# Patient Record
Sex: Male | Born: 1995 | Race: White | Hispanic: No | Marital: Single | Smoking: Never smoker
Health system: Southern US, Community
[De-identification: ages and names within clinical notes are randomized; demographics above are authoritative.]

## PROBLEM LIST (undated history)

## (undated) DIAGNOSIS — M419 Scoliosis, unspecified: Secondary | ICD-10-CM

## (undated) DIAGNOSIS — F32A Depression, unspecified: Secondary | ICD-10-CM

## (undated) DIAGNOSIS — E039 Hypothyroidism, unspecified: Secondary | ICD-10-CM

## (undated) DIAGNOSIS — L709 Acne, unspecified: Secondary | ICD-10-CM

## (undated) DIAGNOSIS — G4730 Sleep apnea, unspecified: Secondary | ICD-10-CM

## (undated) HISTORY — DX: Depression, unspecified: F32.A

## (undated) HISTORY — PX: TYMPANOSTOMY TUBE PLACEMENT: SHX32

---
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
## 2017-08-18 DIAGNOSIS — R0683 Snoring: Secondary | ICD-10-CM | POA: Insufficient documentation

## 2017-08-18 DIAGNOSIS — H6983 Other specified disorders of Eustachian tube, bilateral: Secondary | ICD-10-CM | POA: Insufficient documentation

## 2017-08-18 DIAGNOSIS — H6993 Unspecified Eustachian tube disorder, bilateral: Secondary | ICD-10-CM | POA: Insufficient documentation

## 2017-10-09 ENCOUNTER — Telehealth (HOSPITAL_COMMUNITY): Payer: Self-pay | Admitting: Professional

## 2017-10-13 ENCOUNTER — Other Ambulatory Visit (HOSPITAL_COMMUNITY): Payer: BLUE CROSS/BLUE SHIELD | Attending: Psychiatry | Admitting: Licensed Clinical Social Worker

## 2017-10-13 DIAGNOSIS — F332 Major depressive disorder, recurrent severe without psychotic features: Secondary | ICD-10-CM | POA: Diagnosis not present

## 2017-10-15 ENCOUNTER — Telehealth (HOSPITAL_COMMUNITY): Payer: Self-pay | Admitting: Professional

## 2017-10-16 ENCOUNTER — Telehealth (HOSPITAL_COMMUNITY): Payer: Self-pay | Admitting: Professional

## 2017-10-16 NOTE — Psych (Signed)
Comprehensive Clinical Assessment (CCA) Note  10/16/2017 Elijah [name] [id number]  Visit Diagnosis:      ICD-10-CM   1. MDD (major depressive disorder), recurrent severe, without psychosis (HCC) F33.2       CCA Part One  Part One has been completed on paper by the patient.  (See scanned document in Chart Review)  CCA Part Two A  Intake/Chief Complaint:  CCA Intake With Chief Complaint CCA Part Two Date: 10/13/17 CCA Part Two Time: 1430 Chief Complaint/Presenting Problem: Pt reports to PHP for CCA per Dr. Elisabeth Most. Pt shares he has struggled with depression for years but it has gotten "much worse" over the last 3 months or so. Pt shares he quit his job due to his symptoms and feeling unable to complete tasks. Pt reports a decrease in appetite and 50lbs in the last year. Pt reports he stays at home and plays video games instead of socializing like he used to. Pt states he was in school [employer]) summer '18 and dropped out after 12 days. Pt tried school again at [employer] for spring semester '19 and had to withdrawal from classes halfway through the semester due to symptoms.  Pt reports passive SI with thoughts of "I don't want to feel anymore" or "It would be easier not to be here anymore" but denies any plans or attempts. Pt denies any acts of self-harm. Pt reports he has seen [facility] for therapy for 2 months but does not see any improvement. He was started on Wellbutrin 1 month ago and does not see any improvement. Pt denies HI/AVH Patients Currently Reported Symptoms/Problems: depression, anxiety, passive SI, anhedonia, hopelessness, worthlessness, decrease in appetite, loss of weight, isolation, irritability, mood swings, racing thoughts, trouble with sleep (too much and lack of) Collateral Involvement: Pt's mother brought him to appointment but stayed in waiting room Individual's Strengths: Supportive family Initial Clinical Notes/Concerns: Pt is hesitant about group therapy. Pt wants to  think about joining group and agrees to contact cln tomorrow.  Mental Health Symptoms Depression:  Depression: Change in energy/activity, Difficulty Concentrating, Fatigue, Hopelessness, Increase/decrease in appetite, Irritability, Sleep (too much or little), Weight gain/loss, Worthlessness  Mania:     Anxiety:      Psychosis:     Trauma:     Obsessions:     Compulsions:     Inattention:     Hyperactivity/Impulsivity:     Oppositional/Defiant Behaviors:     Borderline Personality:     Other Mood/Personality Symptoms:      Mental Status Exam Appearance and self-care  Stature:  Stature: Average  Weight:  Weight: Average weight  Clothing:  Clothing: Casual  Grooming:  Grooming: Normal  Cosmetic use:  Cosmetic Use: None  Posture/gait:  Posture/Gait: Normal  Motor activity:  Motor Activity: Not Remarkable  Sensorium  Attention:  Attention: Normal  Concentration:  Concentration: Normal  Orientation:  Orientation: X5  Recall/memory:  Recall/Memory: Normal  Affect and Mood  Affect:  Affect: Flat  Mood:  Mood: Depressed  Relating  Eye contact:  Eye Contact: Fleeting  Facial expression:  Facial Expression: Depressed  Attitude toward examiner:  Attitude Toward Examiner: Cooperative  Thought and Language  Speech flow: Speech Flow: Soft, Normal  Thought content:  Thought Content: Appropriate to mood and circumstances  Preoccupation:     Hallucinations:     Organization:     Company secretary of Knowledge:     Intelligence:  Intelligence: Average  Abstraction:  Abstraction: Normal  Judgement:  Judgement:  Poor  Reality Testing:  Reality Testing: Adequate  Insight:  Insight: Poor  Decision Making:  Decision Making: Confused  Social Functioning  Social Maturity:  Social Maturity: Isolates  Social Judgement:  Social Judgement: Normal  Stress  Stressors:  Stressors: Arts administrator, Work, Illness  Coping Ability:  Coping Ability: Horticulturist, commercial Deficits:     Supports:       Family and Psychosocial History: Family history Marital status: Single Does patient have children?: No  Childhood History:  Childhood History By whom was/is the patient raised?: Both parents Additional childhood history information: Pt reports parents divorced when he was 7. Father moved away when he was 10 and saw him 4x a year. Father moved back to [place] when pt was 18. Mother remarried when pt was teen Description of patient's relationship with caregiver when they were a child: Pt reports a "good relationship though we are very different" with mother. Pt shares he was not very close with Dad because pt feels Dad put more effort and energy into older brother because they both played hockey and pt does not. Patient's description of current relationship with people who raised him/her: Pt reports mother is still supportive. Pt lives with mother and step-father now. Pt reports he is not talking to his biological father at this time. Does patient have siblings?: Yes Number of Siblings: 2 Description of patient's current relationship with siblings: 1 older brother: pt reports they are close and see each other about 1x a month. 1 older step-sister: pt reports they do not talk much Did patient suffer any verbal/emotional/physical/sexual abuse as a child?: No(Pt reports a ex-gf cheated on him while in high school and believes that is emotional abuse.) Did patient suffer from severe childhood neglect?: No Has patient ever been sexually abused/assaulted/raped as an adolescent or adult?: No Was the patient ever a victim of a crime or a disaster?: No Witnessed domestic violence?: No Has patient been effected by domestic violence as an adult?: No  CCA Part Two B  Employment/Work Situation: Employment / Work Psychologist, occupational Employment situation: Unemployed Patient's job has been impacted by current illness: Yes Describe how patient's job has been impacted: Pt reports he quit his job due to being unable to  get to work and concentrate on tasks due to depression. 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?: No Are There Guns or Other Weapons in Your Home?: No  Education: Education Did Garment/textile technologist From [employer]?: Yes Did Theme park manager?: Yes(Pt attended [employer] and [employer]. Pt dropped out of both first semester) Did You Have An Individualized Education Program (IIEP): No Did You Have Any Difficulty At School?: Yes(Pt dropped out of [employer] and [employer] due to depression symptoms) Were Any Medications Ever Prescribed For These Difficulties?: No  Religion: Religion/Spirituality Are You A Religious Person?: No  Leisure/Recreation: Leisure / Recreation Leisure and Hobbies: video games; watch You Tube videos  Exercise/Diet: Exercise/Diet Do You Exercise?: No Have You Gained or Lost A Significant Amount of Weight in the Past Six Months?: Yes-Lost Number of Pounds Lost?: 50(Pt reports loss of 50lbs in a year without trying) Do You Follow a Special Diet?: No Do You Have Any Trouble Sleeping?: Yes Explanation of Sleeping Difficulties: Pt reports he feels he sleeps too much at times and then cannot sleep at others. Pt shares  he is up until 2-5am and wakes up 11a-12:30p most days. Pt shares he struggles to get to sleep most evenings.  CCA Part Two C  Alcohol/Drug Use: Alcohol / Drug Use Prescriptions: Wellburtin 300mg  History of alcohol / drug use?: Yes Substance #1 Name of Substance 1: Marijuana 1 - Age of First Use: 19 1 - Amount (size/oz): 3.5 grams to 14 grams  1 - Frequency: daily 1 - Duration: 2 years 1 - Last Use / Amount: 2 days ago - 3.5 grams (pt reports he is not using every day right now due to lack of money to buy pot)    CCA Part Three  ASAM's:  Six Dimensions of Multidimensional  Assessment  Dimension 1:  Acute Intoxication and/or Withdrawal Potential:     Dimension 2:  Biomedical Conditions and Complications:     Dimension 3:  Emotional, Behavioral, or Cognitive Conditions and Complications:     Dimension 4:  Readiness to Change:     Dimension 5:  Relapse, Continued use, or Continued Problem Potential:     Dimension 6:  Recovery/Living Environment:      Substance use Disorder (SUD)    Social Function:  Social Functioning Social Maturity: Isolates Social Judgement: Normal  Stress:  Stress Stressors: Arts administratorMoney, Work, Illness Coping Ability: Exhausted Patient Takes Medications The Way The Doctor Instructed?: Yes Priority Risk: Moderate Risk  Risk Assessment- Self-Harm Potential: Risk Assessment For Self-Harm Potential Thoughts of Self-Harm: Vague current thoughts Method: No plan Availability of Means: No access/NA Additional Comments for Self-Harm Potential: Pt reports current passive SI but denies a plan or previous attempts  Risk Assessment -Dangerous to Others Potential: Risk Assessment For Dangerous to Others Potential Method: No Plan  DSM5 Diagnoses: There are no active problems to display for this patient.   Patient Centered Plan: Patient is on the following Treatment Plan(s):  Depression  Recommendations for Services/Supports/Treatments: Recommendations for Services/Supports/Treatments Recommendations For Services/Supports/Treatments: Partial Hospitalization(Pt could benefit from medication management and learning coping skills to manage skills. Pt has passive SI. Pt reports he is unsure about participating in group and would like to think about it and call cln tomorrow.)  Treatment Plan Summary:  Pt reports "I don't want to feel like this."  Referrals to Alternative Service(s): Referred to Alternative Service(s):   Place:   Date:   Time:    Referred to Alternative Service(s):   Place:   Date:   Time:    Referred to Alternative Service(s):    Place:   Date:   Time:    Referred to Alternative Service(s):   Place:   Date:   Time:     Elijah [name] J Ryan Ogborn, LPCA. LCASA

## 2019-02-23 DIAGNOSIS — F900 Attention-deficit hyperactivity disorder, predominantly inattentive type: Secondary | ICD-10-CM | POA: Diagnosis not present

## 2019-02-23 DIAGNOSIS — F4322 Adjustment disorder with anxiety: Secondary | ICD-10-CM | POA: Diagnosis not present

## 2019-04-19 DIAGNOSIS — Z03818 Encounter for observation for suspected exposure to other biological agents ruled out: Secondary | ICD-10-CM | POA: Diagnosis not present

## 2019-04-19 DIAGNOSIS — R05 Cough: Secondary | ICD-10-CM | POA: Diagnosis not present

## 2019-06-25 DIAGNOSIS — U071 COVID-19: Secondary | ICD-10-CM | POA: Diagnosis not present

## 2019-07-21 DIAGNOSIS — F419 Anxiety disorder, unspecified: Secondary | ICD-10-CM | POA: Diagnosis not present

## 2019-07-21 DIAGNOSIS — F338 Other recurrent depressive disorders: Secondary | ICD-10-CM | POA: Diagnosis not present

## 2019-07-21 DIAGNOSIS — F902 Attention-deficit hyperactivity disorder, combined type: Secondary | ICD-10-CM | POA: Diagnosis not present

## 2019-07-21 DIAGNOSIS — Z79899 Other long term (current) drug therapy: Secondary | ICD-10-CM | POA: Diagnosis not present

## 2019-08-18 DIAGNOSIS — F902 Attention-deficit hyperactivity disorder, combined type: Secondary | ICD-10-CM | POA: Diagnosis not present

## 2019-08-18 DIAGNOSIS — F419 Anxiety disorder, unspecified: Secondary | ICD-10-CM | POA: Diagnosis not present

## 2019-08-18 DIAGNOSIS — F338 Other recurrent depressive disorders: Secondary | ICD-10-CM | POA: Diagnosis not present

## 2019-08-18 DIAGNOSIS — Z79899 Other long term (current) drug therapy: Secondary | ICD-10-CM | POA: Diagnosis not present

## 2019-11-17 DIAGNOSIS — F419 Anxiety disorder, unspecified: Secondary | ICD-10-CM | POA: Diagnosis not present

## 2019-11-17 DIAGNOSIS — F902 Attention-deficit hyperactivity disorder, combined type: Secondary | ICD-10-CM | POA: Diagnosis not present

## 2019-11-17 DIAGNOSIS — F338 Other recurrent depressive disorders: Secondary | ICD-10-CM | POA: Diagnosis not present

## 2019-11-17 DIAGNOSIS — Z79899 Other long term (current) drug therapy: Secondary | ICD-10-CM | POA: Diagnosis not present

## 2019-12-14 DIAGNOSIS — F332 Major depressive disorder, recurrent severe without psychotic features: Secondary | ICD-10-CM | POA: Diagnosis not present

## 2019-12-20 DIAGNOSIS — F332 Major depressive disorder, recurrent severe without psychotic features: Secondary | ICD-10-CM | POA: Diagnosis not present

## 2019-12-29 DIAGNOSIS — F332 Major depressive disorder, recurrent severe without psychotic features: Secondary | ICD-10-CM | POA: Diagnosis not present

## 2020-01-09 DIAGNOSIS — F332 Major depressive disorder, recurrent severe without psychotic features: Secondary | ICD-10-CM | POA: Diagnosis not present

## 2020-01-10 DIAGNOSIS — F332 Major depressive disorder, recurrent severe without psychotic features: Secondary | ICD-10-CM | POA: Diagnosis not present

## 2020-01-13 DIAGNOSIS — F332 Major depressive disorder, recurrent severe without psychotic features: Secondary | ICD-10-CM | POA: Diagnosis not present

## 2020-01-16 DIAGNOSIS — F332 Major depressive disorder, recurrent severe without psychotic features: Secondary | ICD-10-CM | POA: Diagnosis not present

## 2020-01-19 DIAGNOSIS — F332 Major depressive disorder, recurrent severe without psychotic features: Secondary | ICD-10-CM | POA: Diagnosis not present

## 2020-01-23 DIAGNOSIS — F332 Major depressive disorder, recurrent severe without psychotic features: Secondary | ICD-10-CM | POA: Diagnosis not present

## 2020-02-13 DIAGNOSIS — F332 Major depressive disorder, recurrent severe without psychotic features: Secondary | ICD-10-CM | POA: Diagnosis not present

## 2020-02-14 DIAGNOSIS — F332 Major depressive disorder, recurrent severe without psychotic features: Secondary | ICD-10-CM | POA: Diagnosis not present

## 2020-02-15 DIAGNOSIS — F332 Major depressive disorder, recurrent severe without psychotic features: Secondary | ICD-10-CM | POA: Diagnosis not present

## 2020-02-17 DIAGNOSIS — F332 Major depressive disorder, recurrent severe without psychotic features: Secondary | ICD-10-CM | POA: Diagnosis not present

## 2020-02-20 DIAGNOSIS — F332 Major depressive disorder, recurrent severe without psychotic features: Secondary | ICD-10-CM | POA: Diagnosis not present

## 2020-02-21 DIAGNOSIS — F332 Major depressive disorder, recurrent severe without psychotic features: Secondary | ICD-10-CM | POA: Diagnosis not present

## 2020-02-22 DIAGNOSIS — F332 Major depressive disorder, recurrent severe without psychotic features: Secondary | ICD-10-CM | POA: Diagnosis not present

## 2020-02-24 DIAGNOSIS — F332 Major depressive disorder, recurrent severe without psychotic features: Secondary | ICD-10-CM | POA: Diagnosis not present

## 2020-02-27 DIAGNOSIS — F332 Major depressive disorder, recurrent severe without psychotic features: Secondary | ICD-10-CM | POA: Diagnosis not present

## 2020-02-28 DIAGNOSIS — F332 Major depressive disorder, recurrent severe without psychotic features: Secondary | ICD-10-CM | POA: Diagnosis not present

## 2020-03-01 DIAGNOSIS — F332 Major depressive disorder, recurrent severe without psychotic features: Secondary | ICD-10-CM | POA: Diagnosis not present

## 2020-03-02 DIAGNOSIS — F332 Major depressive disorder, recurrent severe without psychotic features: Secondary | ICD-10-CM | POA: Diagnosis not present

## 2020-03-05 DIAGNOSIS — F332 Major depressive disorder, recurrent severe without psychotic features: Secondary | ICD-10-CM | POA: Diagnosis not present

## 2020-03-06 DIAGNOSIS — F332 Major depressive disorder, recurrent severe without psychotic features: Secondary | ICD-10-CM | POA: Diagnosis not present

## 2020-03-08 DIAGNOSIS — F332 Major depressive disorder, recurrent severe without psychotic features: Secondary | ICD-10-CM | POA: Diagnosis not present

## 2020-03-13 DIAGNOSIS — F332 Major depressive disorder, recurrent severe without psychotic features: Secondary | ICD-10-CM | POA: Diagnosis not present

## 2020-03-26 DIAGNOSIS — F332 Major depressive disorder, recurrent severe without psychotic features: Secondary | ICD-10-CM | POA: Diagnosis not present

## 2020-03-27 DIAGNOSIS — F332 Major depressive disorder, recurrent severe without psychotic features: Secondary | ICD-10-CM | POA: Diagnosis not present

## 2020-03-28 DIAGNOSIS — F332 Major depressive disorder, recurrent severe without psychotic features: Secondary | ICD-10-CM | POA: Diagnosis not present

## 2020-04-03 DIAGNOSIS — F332 Major depressive disorder, recurrent severe without psychotic features: Secondary | ICD-10-CM | POA: Diagnosis not present

## 2020-04-04 DIAGNOSIS — F332 Major depressive disorder, recurrent severe without psychotic features: Secondary | ICD-10-CM | POA: Diagnosis not present

## 2020-04-09 DIAGNOSIS — F332 Major depressive disorder, recurrent severe without psychotic features: Secondary | ICD-10-CM | POA: Diagnosis not present

## 2020-04-10 DIAGNOSIS — F332 Major depressive disorder, recurrent severe without psychotic features: Secondary | ICD-10-CM | POA: Diagnosis not present

## 2020-04-11 ENCOUNTER — Ambulatory Visit: Payer: BC Managed Care – PPO | Admitting: Family Medicine

## 2020-04-11 ENCOUNTER — Encounter: Payer: Self-pay | Admitting: Family Medicine

## 2020-04-11 ENCOUNTER — Other Ambulatory Visit: Payer: Self-pay

## 2020-04-11 VITALS — BP 120/64 | HR 55 | Temp 97.7°F | Wt 211.4 lb

## 2020-04-11 DIAGNOSIS — K645 Perianal venous thrombosis: Secondary | ICD-10-CM | POA: Diagnosis not present

## 2020-04-11 DIAGNOSIS — K625 Hemorrhage of anus and rectum: Secondary | ICD-10-CM

## 2020-04-11 DIAGNOSIS — K6289 Other specified diseases of anus and rectum: Secondary | ICD-10-CM | POA: Diagnosis not present

## 2020-04-11 MED ORDER — HYDROCODONE-ACETAMINOPHEN 5-325 MG PO TABS: 1.0000 | ORAL_TABLET | Freq: Four times a day (QID) | ORAL | 0 refills | Status: AC | PRN

## 2020-04-11 NOTE — Progress Notes (Signed)
Subjective:    Patient ID: Elijah [name], male    DOB: November 23, 1995, 24 y.o.   MRN: [id number]  HPI Chief Complaint  Patient presents with  . Rectal Bleeding    Rectal bleeding since Monday. None stop, pain when sitting down since saturday   He is new to the practice and reports no regular medical care recently.   Here with complaints of rectal pain for the past 5 days and bright red blood from his rectum for the past 3 days. States he is seeing a significant amount of blood. He has noticed constant bleeding. Blood on his sheets.   States he is having 2 bowel movements per day which is his usual.   Denies fever, chills, dizziness, fatigue, unexplained weight loss,  chest pain, palpitations, shortness of breath, abdominal pain, back pain,  N/V/D, urinary symptoms, testicle pain.  Denies history of hemorrhoids or rectal surgery.   Denies personal or family history of IBD or colon cancer.   Taking 2 Aleve twice daily for rectal pain.   States he is seeing a therapist for depression.   Single, no kids. In between jobs.    Review of Systems Pertinent positives and negatives in the history of present illness.     Objective:   Physical Exam Exam conducted with a chaperone present.  Constitutional:      Appearance: Normal appearance.  Eyes:     Conjunctiva/sclera: Conjunctivae normal.     Pupils: Pupils are equal, round, and reactive to light.  Cardiovascular:     Rate and Rhythm: Normal rate and regular rhythm.     Pulses: Normal pulses.     Heart sounds: Normal heart sounds.  Pulmonary:     Effort: Pulmonary effort is normal.     Breath sounds: Normal breath sounds.  Abdominal:     General: Abdomen is flat. Bowel sounds are normal. There is no distension.     Palpations: Abdomen is soft.     Tenderness: There is no abdominal tenderness.  Genitourinary:    Rectum: Internal hemorrhoid present.     Comments: Homero Fellers blood present on rectal examination.  Thrombosed  hemorrhoid present with TTP.  Musculoskeletal:        General: Normal range of motion.     Cervical back: Normal range of motion and neck supple.  Skin:    General: Skin is warm and dry.     Coloration: Skin is not pale.  Neurological:     General: No focal deficit present.     Mental Status: He is alert and oriented to person, place, and time.  Psychiatric:        Mood and Affect: Mood normal.        Thought Content: Thought content normal.    BP 120/64   Pulse (!) 55   Temp 97.7 F (36.5 C)   Wt 211 lb 6.4 oz (95.9 kg)       Assessment & Plan:   Thrombosed hemorrhoids - Plan: HYDROcodone-acetaminophen (NORCO) 5-325 MG tablet  Rectal pain - Plan: CBC with Differential/Platelet, Comprehensive metabolic panel, HYDROcodone-acetaminophen (NORCO) 5-325 MG tablet  BRBPR (bright red blood per rectum) - Plan: CBC with Differential/Platelet, Comprehensive metabolic panel   Kristian Covey, PA-C assisted in procedure: Discussed examination findings, diagnosis, usual course of illness, and options for therapy discussed. After discussing recommendations, patient agrees to procedure for thrombosed hemorrhoid  Procedure Informed consent obtained.  The area was prepped in the usual manner and the skin overlying the  abscess was anesthetized with 1.5cc of 1% lidocaine with epinephrine.   Hemostats were used to isolate and clamp the thrombosed hoemorrhoid.  Scapel used to remove the thrombosed tissue.  Area was irrigated with high pressure saline.  Gauze was placed loosely to absorb seepage.   Discussed after care, hot soapy bath soaks, use of stool softener and baby wipes the next few days, Aleve OTC the next 3-5 days.  Norco prescribed for severe pain and caution advised. PDMP reviewed.   Procedure note by Kristian Covey, PA-C   Follow up: prn.  However, if worse or not improving, recheck promptly.

## 2020-04-11 NOTE — Patient Instructions (Signed)
You may want to take a stool softener for the next week.  It is ok to take Aleve.  Use the hydrocodone as needed for severe pain. Use caution and avoid driving or alcohol with this medication.   Stay well hydrated and have a liquid or soft diet over the next 1-2 days.   If you have any fever, chills, nausea, vomiting or significant bleeding, let me know.     Surgical Procedures for Hemorrhoids, Care After This sheet gives you information about how to care for yourself after your procedure. Your health care provider may also give you more specific instructions. If you have problems or questions, contact your health care provider. What can I expect after the procedure? After the procedure, it is common to have:  Rectal pain.  Pain when you are having a bowel movement.  Slight rectal bleeding. This is more likely to happen with the first bowel movement after surgery. Follow these instructions at home: Medicines  Take over-the-counter and prescription medicines only as told by your health care provider.  If you were prescribed an antibiotic medicine, use it as told by your health care provider. Do not stop using the antibiotic even if your condition improves.  Ask your health care provider if the medicine prescribed to you requires you to avoid driving or using heavy machinery.  Use a stool softener or a bulk laxative as told by your health care provider. Eating and drinking  Follow instructions from your health care provider about what to eat or drink after your procedure.  You may need to take actions to prevent or treat constipation, such as: ? Drink enough fluid to keep your urine pale yellow. ? Take over-the-counter or prescription medicines. ? Eat foods that are high in fiber, such as beans, whole grains, and fresh fruits and vegetables. ? Limit foods that are high in fat and processed sugars, such as fried or sweet foods. Activity   Rest as told by your health care  provider.  Avoid sitting for a long time without moving. Get up to take short walks every 1-2 hours. This is important to improve blood flow and breathing. Ask for help if you feel weak or unsteady.  Return to your normal activities as told by your health care provider. Ask your health care provider what activities are safe for you.  Do not lift anything that is heavier than 10 lb (4.5 kg), or the limit that you are told, until your health care provider says that it is safe.  Do not strain to have a bowel movement.  Do not spend a long time sitting on the toilet. General instructions   Take warm sitz baths for 15-20 minutes, 2-3 times a day to relieve soreness or itching and to keep the rectal area clean.  Apply ice packs to the area to reduce swelling and pain.  Do not drive for 24 hours if you were given a sedative during your procedure.  Keep all follow-up visits as told by your health care provider. This is important. Contact a health care provider if:  Your pain medicine is not helping.  You have a fever or chills.  You have bad smelling drainage.  You have a lot of swelling.  You become constipated.  You have trouble passing urine. Get help right away if:  You have very bad rectal pain.  You have heavy bleeding from your rectum. Summary  After the procedure, it is common to have pain and slight rectal  bleeding.  Take warm sitz baths for 15-20 minutes, 2-3 times a day to relieve soreness or itching and to keep the rectal area clean.  Avoid straining when having a bowel movement.  Eat foods that are high in fiber, such as beans, whole grains, and fresh fruits and vegetables.  Take over-the-counter and prescription medicines only as told by your health care provider. This information is not intended to replace advice given to you by your health care provider. Make sure you discuss any questions you have with your health care provider. Document Revised: 09/29/2018  Document Reviewed: 03/02/2018 Elsevier Patient Education  2020 [employer].

## 2020-04-12 LAB — CBC WITH DIFFERENTIAL/PLATELET
Basophils Absolute: 0 10*3/uL (ref 0.0–0.2)
Basos: 0 %
EOS (ABSOLUTE): 0.2 10*3/uL (ref 0.0–0.4)
Eos: 4 %
Hematocrit: 45.7 % (ref 37.5–51.0)
Hemoglobin: 15.4 g/dL (ref 13.0–17.7)
Immature Grans (Abs): 0 10*3/uL (ref 0.0–0.1)
Immature Granulocytes: 1 %
Lymphocytes Absolute: 2 10*3/uL (ref 0.7–3.1)
Lymphs: 33 %
MCH: 30.3 pg (ref 26.6–33.0)
MCHC: 33.7 g/dL (ref 31.5–35.7)
MCV: 90 fL (ref 79–97)
Monocytes Absolute: 0.5 10*3/uL (ref 0.1–0.9)
Monocytes: 9 %
Neutrophils Absolute: 3.2 10*3/uL (ref 1.4–7.0)
Neutrophils: 53 %
Platelets: 234 10*3/uL (ref 150–450)
RBC: 5.09 x10E6/uL (ref 4.14–5.80)
RDW: 12.6 % (ref 11.6–15.4)
WBC: 6 10*3/uL (ref 3.4–10.8)

## 2020-04-12 LAB — COMPREHENSIVE METABOLIC PANEL
ALT: 26 IU/L (ref 0–44)
AST: 16 IU/L (ref 0–40)
Albumin/Globulin Ratio: 2.2 (ref 1.2–2.2)
Albumin: 4.7 g/dL (ref 4.1–5.2)
Alkaline Phosphatase: 65 IU/L (ref 44–121)
BUN/Creatinine Ratio: 14 (ref 9–20)
BUN: 13 mg/dL (ref 6–20)
Bilirubin Total: 0.4 mg/dL (ref 0.0–1.2)
CO2: 21 mmol/L (ref 20–29)
Calcium: 9.5 mg/dL (ref 8.7–10.2)
Chloride: 104 mmol/L (ref 96–106)
Creatinine, Ser: 0.96 mg/dL (ref 0.76–1.27)
GFR calc Af Amer: 129 mL/min/{1.73_m2} (ref >59)
GFR calc non Af Amer: 112 mL/min/{1.73_m2} (ref >59)
Globulin, Total: 2.1 g/dL (ref 1.5–4.5)
Glucose: 100 mg/dL — ABNORMAL HIGH (ref 65–99)
Potassium: 4.3 mmol/L (ref 3.5–5.2)
Sodium: 142 mmol/L (ref 134–144)
Total Protein: 6.8 g/dL (ref 6.0–8.5)

## 2020-04-12 NOTE — Progress Notes (Signed)
His labs are fine. How is he doing today?

## 2020-04-17 ENCOUNTER — Encounter: Payer: Self-pay | Admitting: Internal Medicine

## 2020-05-02 DIAGNOSIS — F4322 Adjustment disorder with anxiety: Secondary | ICD-10-CM | POA: Diagnosis not present

## 2020-05-02 DIAGNOSIS — F900 Attention-deficit hyperactivity disorder, predominantly inattentive type: Secondary | ICD-10-CM | POA: Diagnosis not present

## 2020-05-10 DIAGNOSIS — F900 Attention-deficit hyperactivity disorder, predominantly inattentive type: Secondary | ICD-10-CM | POA: Diagnosis not present

## 2020-05-10 DIAGNOSIS — F4322 Adjustment disorder with anxiety: Secondary | ICD-10-CM | POA: Diagnosis not present

## 2020-05-23 DIAGNOSIS — F900 Attention-deficit hyperactivity disorder, predominantly inattentive type: Secondary | ICD-10-CM | POA: Diagnosis not present

## 2020-05-23 DIAGNOSIS — F4322 Adjustment disorder with anxiety: Secondary | ICD-10-CM | POA: Diagnosis not present

## 2020-05-28 DIAGNOSIS — F4322 Adjustment disorder with anxiety: Secondary | ICD-10-CM | POA: Diagnosis not present

## 2020-05-28 DIAGNOSIS — F900 Attention-deficit hyperactivity disorder, predominantly inattentive type: Secondary | ICD-10-CM | POA: Diagnosis not present

## 2020-05-31 DIAGNOSIS — F4322 Adjustment disorder with anxiety: Secondary | ICD-10-CM | POA: Diagnosis not present

## 2020-05-31 DIAGNOSIS — F900 Attention-deficit hyperactivity disorder, predominantly inattentive type: Secondary | ICD-10-CM | POA: Diagnosis not present

## 2020-06-06 DIAGNOSIS — F4322 Adjustment disorder with anxiety: Secondary | ICD-10-CM | POA: Diagnosis not present

## 2020-06-06 DIAGNOSIS — F900 Attention-deficit hyperactivity disorder, predominantly inattentive type: Secondary | ICD-10-CM | POA: Diagnosis not present

## 2020-06-07 DIAGNOSIS — F900 Attention-deficit hyperactivity disorder, predominantly inattentive type: Secondary | ICD-10-CM | POA: Diagnosis not present

## 2020-06-07 DIAGNOSIS — F4322 Adjustment disorder with anxiety: Secondary | ICD-10-CM | POA: Diagnosis not present

## 2020-06-15 DIAGNOSIS — F4322 Adjustment disorder with anxiety: Secondary | ICD-10-CM | POA: Diagnosis not present

## 2020-06-15 DIAGNOSIS — F900 Attention-deficit hyperactivity disorder, predominantly inattentive type: Secondary | ICD-10-CM | POA: Diagnosis not present

## 2020-06-21 DIAGNOSIS — F900 Attention-deficit hyperactivity disorder, predominantly inattentive type: Secondary | ICD-10-CM | POA: Diagnosis not present

## 2020-06-21 DIAGNOSIS — F4322 Adjustment disorder with anxiety: Secondary | ICD-10-CM | POA: Diagnosis not present

## 2020-06-22 DIAGNOSIS — F39 Unspecified mood [affective] disorder: Secondary | ICD-10-CM | POA: Diagnosis not present

## 2020-06-28 DIAGNOSIS — F4322 Adjustment disorder with anxiety: Secondary | ICD-10-CM | POA: Diagnosis not present

## 2020-06-28 DIAGNOSIS — F900 Attention-deficit hyperactivity disorder, predominantly inattentive type: Secondary | ICD-10-CM | POA: Diagnosis not present

## 2020-07-05 DIAGNOSIS — F39 Unspecified mood [affective] disorder: Secondary | ICD-10-CM | POA: Diagnosis not present

## 2020-07-20 DIAGNOSIS — F4322 Adjustment disorder with anxiety: Secondary | ICD-10-CM | POA: Diagnosis not present

## 2020-07-20 DIAGNOSIS — F900 Attention-deficit hyperactivity disorder, predominantly inattentive type: Secondary | ICD-10-CM | POA: Diagnosis not present

## 2020-07-24 DIAGNOSIS — F39 Unspecified mood [affective] disorder: Secondary | ICD-10-CM | POA: Diagnosis not present

## 2020-08-29 DIAGNOSIS — F4322 Adjustment disorder with anxiety: Secondary | ICD-10-CM | POA: Diagnosis not present

## 2020-09-06 DIAGNOSIS — F4322 Adjustment disorder with anxiety: Secondary | ICD-10-CM | POA: Diagnosis not present

## 2020-10-05 DIAGNOSIS — F39 Unspecified mood [affective] disorder: Secondary | ICD-10-CM | POA: Diagnosis not present

## 2020-10-11 DIAGNOSIS — F4322 Adjustment disorder with anxiety: Secondary | ICD-10-CM | POA: Diagnosis not present

## 2020-10-17 DIAGNOSIS — F900 Attention-deficit hyperactivity disorder, predominantly inattentive type: Secondary | ICD-10-CM | POA: Diagnosis not present

## 2020-10-17 DIAGNOSIS — F4322 Adjustment disorder with anxiety: Secondary | ICD-10-CM | POA: Diagnosis not present

## 2020-10-26 DIAGNOSIS — F39 Unspecified mood [affective] disorder: Secondary | ICD-10-CM | POA: Diagnosis not present

## 2020-11-01 DIAGNOSIS — F4322 Adjustment disorder with anxiety: Secondary | ICD-10-CM | POA: Diagnosis not present

## 2020-11-07 DIAGNOSIS — F4322 Adjustment disorder with anxiety: Secondary | ICD-10-CM | POA: Diagnosis not present

## 2020-11-07 DIAGNOSIS — F39 Unspecified mood [affective] disorder: Secondary | ICD-10-CM | POA: Diagnosis not present

## 2020-11-16 DIAGNOSIS — F39 Unspecified mood [affective] disorder: Secondary | ICD-10-CM | POA: Diagnosis not present

## 2020-11-20 DIAGNOSIS — F4322 Adjustment disorder with anxiety: Secondary | ICD-10-CM | POA: Diagnosis not present

## 2020-11-28 DIAGNOSIS — F39 Unspecified mood [affective] disorder: Secondary | ICD-10-CM | POA: Diagnosis not present

## 2020-11-30 DIAGNOSIS — F900 Attention-deficit hyperactivity disorder, predominantly inattentive type: Secondary | ICD-10-CM | POA: Diagnosis not present

## 2020-11-30 DIAGNOSIS — F4322 Adjustment disorder with anxiety: Secondary | ICD-10-CM | POA: Diagnosis not present

## 2020-12-05 DIAGNOSIS — F900 Attention-deficit hyperactivity disorder, predominantly inattentive type: Secondary | ICD-10-CM | POA: Diagnosis not present

## 2020-12-05 DIAGNOSIS — F4322 Adjustment disorder with anxiety: Secondary | ICD-10-CM | POA: Diagnosis not present

## 2020-12-06 DIAGNOSIS — F4322 Adjustment disorder with anxiety: Secondary | ICD-10-CM | POA: Diagnosis not present

## 2020-12-06 DIAGNOSIS — F900 Attention-deficit hyperactivity disorder, predominantly inattentive type: Secondary | ICD-10-CM | POA: Diagnosis not present

## 2020-12-06 DIAGNOSIS — F39 Unspecified mood [affective] disorder: Secondary | ICD-10-CM | POA: Diagnosis not present

## 2020-12-13 DIAGNOSIS — F4322 Adjustment disorder with anxiety: Secondary | ICD-10-CM | POA: Diagnosis not present

## 2020-12-13 DIAGNOSIS — F900 Attention-deficit hyperactivity disorder, predominantly inattentive type: Secondary | ICD-10-CM | POA: Diagnosis not present

## 2020-12-13 DIAGNOSIS — F39 Unspecified mood [affective] disorder: Secondary | ICD-10-CM | POA: Diagnosis not present

## 2020-12-19 DIAGNOSIS — F900 Attention-deficit hyperactivity disorder, predominantly inattentive type: Secondary | ICD-10-CM | POA: Diagnosis not present

## 2020-12-19 DIAGNOSIS — F4322 Adjustment disorder with anxiety: Secondary | ICD-10-CM | POA: Diagnosis not present

## 2020-12-26 DIAGNOSIS — F4322 Adjustment disorder with anxiety: Secondary | ICD-10-CM | POA: Diagnosis not present

## 2020-12-26 NOTE — Progress Notes (Deleted)
   Subjective:    Patient ID: Elijah [name], male    DOB: 06-02-1995, 25 y.o.   MRN: [id number]  HPI No chief complaint on file.  He is new to the practice and here for a complete physical exam. Previous medical care: Last CPE:  Other providers:  Past medical history: Surgeries:  Family history: Mental health history:  Social history: Lives with ***, works as ***,  *** Smoking, drinking alcohol, drug use Diet: *** Exercise: ***  Immunizations:  Health maintenance:   Colonoscopy: Last PSA: Last Dental Exam: Last Eye Exam:  Wears seatbelt always, uses sunscreen, smoke detectors in home and functioning, does not text while driving, feels safe in home environment.  Reviewed allergies, medications, past medical, surgical, family, and social history.    Review of Systems Review of Systems Constitutional: -fever, -chills, -sweats, -unexpected weight change,-fatigue ENT: -runny nose, -ear pain, -sore throat Cardiology:  -chest pain, -palpitations, -edema Respiratory: -cough, -shortness of breath, -wheezing Gastroenterology: -abdominal pain, -nausea, -vomiting, -diarrhea, -constipation  Hematology: -bleeding or bruising problems Musculoskeletal: -arthralgias, -myalgias, -joint swelling, -back pain Ophthalmology: -vision changes Urology: -dysuria, -difficulty urinating, -hematuria, -urinary frequency, -urgency Neurology: -headache, -weakness, -tingling, -numbness       Objective:   Physical Exam There were no vitals taken for this visit.  General Appearance:    Alert, cooperative, no distress, appears stated age  Head:    Normocephalic, without obvious abnormality, atraumatic  Eyes:    PERRL, conjunctiva/corneas clear, EOM's intact, fundi    benign  Ears:    Normal TM's and external ear canals  Nose:   Nares normal, mucosa normal, no drainage or sinus   tenderness  Throat:   Lips, mucosa, and tongue normal; teeth and gums normal  Neck:   Supple, no  lymphadenopathy;  thyroid:  no   enlargement/tenderness/nodules; no carotid   bruit or JVD  Back:    Spine nontender, no curvature, ROM normal, no CVA     tenderness  Lungs:     Clear to auscultation bilaterally without wheezes, rales or     ronchi; respirations unlabored  Chest Wall:    No tenderness or deformity   Heart:    Regular rate and rhythm, S1 and S2 normal, no murmur, rub   or gallop  Breast Exam:    No chest wall tenderness, masses or gynecomastia  Abdomen:     Soft, non-tender, nondistended, normoactive bowel sounds,    no masses, no hepatosplenomegaly  Genitalia:    Normal male external genitalia without lesions.  Testicles without masses.  No inguinal hernias.  Rectal:   Deferred due to age <40 and lack of symptoms  Extremities:   No clubbing, cyanosis or edema  Pulses:   2+ and symmetric all extremities  Skin:   Skin color, texture, turgor normal, no rashes or lesions  Lymph nodes:   Cervical, supraclavicular, and axillary nodes normal  Neurologic:   CNII-XII intact, normal strength, sensation and gait; reflexes 2+ and symmetric throughout          Psych:   Normal mood, affect, hygiene and grooming.          Assessment & Plan:  Routine general medical examination at a health care facility

## 2020-12-27 ENCOUNTER — Encounter: Payer: BC Managed Care – PPO | Admitting: Family Medicine

## 2020-12-27 DIAGNOSIS — Z0000 Encounter for general adult medical examination without abnormal findings: Secondary | ICD-10-CM

## 2021-01-01 ENCOUNTER — Encounter: Payer: Self-pay | Admitting: Family Medicine

## 2021-01-02 DIAGNOSIS — F4322 Adjustment disorder with anxiety: Secondary | ICD-10-CM | POA: Diagnosis not present

## 2021-01-02 DIAGNOSIS — F900 Attention-deficit hyperactivity disorder, predominantly inattentive type: Secondary | ICD-10-CM | POA: Diagnosis not present

## 2021-01-04 DIAGNOSIS — F900 Attention-deficit hyperactivity disorder, predominantly inattentive type: Secondary | ICD-10-CM | POA: Diagnosis not present

## 2021-01-04 DIAGNOSIS — F4322 Adjustment disorder with anxiety: Secondary | ICD-10-CM | POA: Diagnosis not present

## 2021-01-09 DIAGNOSIS — F39 Unspecified mood [affective] disorder: Secondary | ICD-10-CM | POA: Diagnosis not present

## 2021-01-11 DIAGNOSIS — F4322 Adjustment disorder with anxiety: Secondary | ICD-10-CM | POA: Diagnosis not present

## 2021-01-16 DIAGNOSIS — F4322 Adjustment disorder with anxiety: Secondary | ICD-10-CM | POA: Diagnosis not present

## 2021-01-23 DIAGNOSIS — F4322 Adjustment disorder with anxiety: Secondary | ICD-10-CM | POA: Diagnosis not present

## 2021-01-23 DIAGNOSIS — F39 Unspecified mood [affective] disorder: Secondary | ICD-10-CM | POA: Diagnosis not present

## 2021-01-27 NOTE — Patient Instructions (Signed)
Preventive Care 21-23 Years Old, Male Preventive care refers to lifestyle choices and visits with your health care provider that can promote health and wellness. This includes: A yearly physical exam. This is also called an annual wellness visit. Regular dental and eye exams. Immunizations. Screening for certain conditions. Healthy lifestyle choices, such as: Eating a healthy diet. Getting regular exercise. Not using drugs or products that contain nicotine and tobacco. Limiting alcohol use. What can I expect for my preventive care visit? Physical exam Your health care provider may check your: Height and weight. These may be used to calculate your BMI (body mass index). BMI is a measurement that tells if you are at a healthy weight. Heart rate and blood pressure. Body temperature. Skin for abnormal spots. Counseling Your health care provider may ask you questions about your: Past medical problems. Family's medical history. Alcohol, tobacco, and drug use. Emotional well-being. Home life and relationship well-being. Sexual activity. Diet, exercise, and sleep habits. Work and work environment. Access to firearms. What immunizations do I need? Vaccines are usually given at various ages, according to a schedule. Your health care provider will recommend vaccines for you based on your age, medical history, and lifestyle or other factors, such as travel or where you work. What tests do I need? Blood tests Lipid and cholesterol levels. These may be checked every 5 years starting at age 20. Hepatitis C test. Hepatitis B test. Screening  Diabetes screening. This is done by checking your blood sugar (glucose) after you have not eaten for a while (fasting). Genital exam to check for testicular cancer or hernias. STD (sexually transmitted disease) testing, if you are at risk. Talk with your health care provider about your test results, treatment options, and if necessary, the need for more  tests. Follow these instructions at home: Eating and drinking  Eat a healthy diet that includes fresh fruits and vegetables, whole grains, lean protein, and low-fat dairy products. Drink enough fluid to keep your urine pale yellow. Take vitamin and mineral supplements as recommended by your health care provider. Do not drink alcohol if your health care provider tells you not to drink. If you drink alcohol: Limit how much you have to 0-2 drinks a day. Be aware of how much alcohol is in your drink. In the [place] one drink equals one 12 oz bottle of beer (355 mL), one 5 oz glass of wine (148 mL), or one 1 oz glass of hard liquor (44 mL). Lifestyle Take daily care of your teeth and gums. Brush your teeth every morning and night with fluoride toothpaste. Floss one time each day. Stay active. Exercise for at least 30 minutes 5 or more days each week. Do not use any products that contain nicotine or tobacco, such as cigarettes, e-cigarettes, and chewing tobacco. If you need help quitting, ask your health care provider. Do not use drugs. If you are sexually active, practice safe sex. Use a condom or other form of protection to prevent STIs (sexually transmitted infections). Find healthy ways to cope with stress, such as: Meditation, yoga, or listening to music. Journaling. Talking to a trusted person. Spending time with friends and family. Safety Always wear your seat belt while driving or riding in a vehicle. Do not drive: If you have been drinking alcohol. Do not ride with someone who has been drinking. When you are tired or distracted. While texting. Wear a helmet and other protective equipment during sports activities. If you have firearms in your house, make sure   you follow all gun safety procedures. Seek help if you have been physically or sexually abused. What's next? Go to your health care provider once a year for an annual wellness visit. Ask your health care provider how often you  should have your eyes and teeth checked. Stay up to date on all vaccines. This information is not intended to replace advice given to you by your health care provider. Make sure you discuss any questions you have with your health care provider. Document Revised: 06/22/2020 Document Reviewed: 04/08/2018 Elsevier Patient Education  2022 Elsevier Inc.  

## 2021-01-27 NOTE — Progress Notes (Signed)
Subjective:    Patient ID: Elijah [name], male    DOB: 06/10/1995, 25 y.o.   MRN: [id number]  HPI Chief Complaint  Patient presents with   Annual Exam    Requests ENT referral to Dr.Shoemaker if possible to look at tonsils   He is fairly new to the practice and here for a complete physical exam.  Other providers: [facility]   Complain of swollen tonsils and would like to see ENT, Dr. Annalee Genta specifically.    States he has had nausea and vomiting almost daily for the past 2 months. States he has to eat small amounts in order to avoid symptoms. Vomits liquid, bile. No blood.  No fever,chills, night sweats, weight loss, abdominal pain, diarrhea or constipation.   Normal bowel movements.   Denies family history of GI cancers.   He is taking medication prescribed by psychiatrist for depression.    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 Flu shot today and Covid booster   Health maintenance:   Last Dental Exam: last week  Last Eye Exam: years ago   Wears seatbelt always, uses sunscreen, smoke detectors in home and functioning, does not text while driving, feels safe in home environment.  Reviewed allergies, medications, past medical, surgical, family, and social history.    Review of Systems Review of Systems Constitutional: -fever, -chills, -sweats, -unexpected weight change,-fatigue ENT: -runny nose, -ear pain, -sore throat Cardiology:  -chest pain, -palpitations, -edema Respiratory: -cough, -shortness of breath, -wheezing Gastroenterology: -abdominal pain, +nausea, +vomiting, -diarrhea, -constipation  Hematology: -bleeding or bruising problems Musculoskeletal: -arthralgias, -myalgias, -joint swelling, -back pain Ophthalmology: -vision changes Urology: -dysuria, -difficulty urinating, -hematuria,  -urinary frequency, -urgency Neurology: -headache, -weakness, -tingling, -numbness       Objective:   Physical Exam BP 118/72 (BP Location: Right Arm, Patient Position: Sitting)   Pulse 97   Ht 5' 8.5" (1.74 m)   Wt 218 lb 6.4 oz (99.1 kg)   SpO2 98%   BMI 32.72 kg/m   General Appearance:    Alert, cooperative, no distress, appears stated age  Head:    Normocephalic, without obvious abnormality, atraumatic  Eyes:    PERRL, conjunctiva/corneas clear, EOM's intact  Ears:    Normal TM's and external ear canals  Nose:   Mask on   Throat:   Lips, mucosa, and tongue normal; teeth and gums normal. Tonsils enlarged bilaterally R>L  Neck:   Supple, no lymphadenopathy;  thyroid:  no   enlargement/tenderness/nodules  Back:    Spine nontender, no curvature, ROM normal, no CVA     tenderness  Lungs:     Clear to auscultation bilaterally without wheezes, rales or     ronchi; respirations unlabored  Chest Wall:    No tenderness or deformity   Heart:    Regular rate and rhythm, S1 and S2 normal, no murmur, rub   or gallop  Breast Exam:    No chest wall tenderness, masses or gynecomastia  Abdomen:     Soft, non-tender, nondistended, normoactive bowel sounds,    no masses, no hepatosplenomegaly  Genitalia:    Normal male external genitalia without lesions.  Testicles without masses.  No inguinal hernias. Chaperone present.   Rectal:   Deferred due to age <40 and lack of symptoms  Extremities:   No clubbing, cyanosis or edema  Pulses:   2+ and symmetric all extremities  Skin:   Skin color, texture, turgor normal, no rashes or lesions  Lymph nodes:   Cervical, supraclavicular, and axillary nodes normal  Neurologic:   CNII-XII intact, normal strength, sensation and gait          Psych:   Normal mood, affect, hygiene and grooming.        Assessment & Plan:  Routine general medical examination at a health care facility - Plan: CBC with Differential/Platelet, Comprehensive metabolic panel, TSH, T4,  free -Preventive health care discussed.  Recommend monthly self testicular exams.  I recommend regular dental and eye exams.  Counseling on healthy lifestyle including diet and exercise.  Immunizations reviewed.  Discussed safety.  Enlarged tonsils - Plan: Ambulatory referral to ENT -discussed that his tonsils are somewhat enlarged. Asymptomatic. Referral to Dr. Annalee Genta per patient request.   Nausea and vomiting, unspecified vomiting type - Plan: CBC with Differential/Platelet, Comprehensive metabolic panel, Lipase, Amylase, Ambulatory referral to Gastroenterology -exam benign. No red flag symptoms. He will try Pepcid bid. Refer to GI.   Needs flu shot - Plan: Flu Vaccine QUAD 6+ mos PF IM (Fluarix Quad PF)  Need for COVID-19 vaccine - Plan: [employer], [employer]  Screen for STD (sexually transmitted disease) - Plan: HIV Antibody (routine testing w rflx), Hepatitis C antibody, RPR, GC/Chlamydia Probe Amp, Trichomonas vaginalis, RNA -done per patient request   Screening for thyroid disorder - Plan: TSH, T4, free

## 2021-01-28 ENCOUNTER — Ambulatory Visit: Payer: BC Managed Care – PPO | Admitting: Family Medicine

## 2021-01-28 ENCOUNTER — Other Ambulatory Visit: Payer: Self-pay

## 2021-01-28 ENCOUNTER — Encounter: Payer: Self-pay | Admitting: Family Medicine

## 2021-01-28 VITALS — BP 118/72 | HR 97 | Ht 68.5 in | Wt 218.4 lb

## 2021-01-28 DIAGNOSIS — Z0000 Encounter for general adult medical examination without abnormal findings: Secondary | ICD-10-CM

## 2021-01-28 DIAGNOSIS — Z1329 Encounter for screening for other suspected endocrine disorder: Secondary | ICD-10-CM

## 2021-01-28 DIAGNOSIS — J351 Hypertrophy of tonsils: Secondary | ICD-10-CM

## 2021-01-28 DIAGNOSIS — R112 Nausea with vomiting, unspecified: Secondary | ICD-10-CM | POA: Diagnosis not present

## 2021-01-28 DIAGNOSIS — Z23 Encounter for immunization: Secondary | ICD-10-CM | POA: Diagnosis not present

## 2021-01-28 DIAGNOSIS — Z113 Encounter for screening for infections with a predominantly sexual mode of transmission: Secondary | ICD-10-CM | POA: Diagnosis not present

## 2021-01-29 ENCOUNTER — Encounter: Payer: Self-pay | Admitting: Gastroenterology

## 2021-01-29 LAB — COMPREHENSIVE METABOLIC PANEL
ALT: 31 IU/L (ref 0–44)
AST: 15 IU/L (ref 0–40)
Albumin/Globulin Ratio: 2.2 (ref 1.2–2.2)
Albumin: 5.1 g/dL (ref 4.1–5.2)
Alkaline Phosphatase: 82 IU/L (ref 44–121)
BUN/Creatinine Ratio: 10 (ref 9–20)
BUN: 11 mg/dL (ref 6–20)
Bilirubin Total: 0.3 mg/dL (ref 0.0–1.2)
CO2: 23 mmol/L (ref 20–29)
Calcium: 9.7 mg/dL (ref 8.7–10.2)
Chloride: 102 mmol/L (ref 96–106)
Creatinine, Ser: 1.05 mg/dL (ref 0.76–1.27)
Globulin, Total: 2.3 g/dL (ref 1.5–4.5)
Glucose: 96 mg/dL (ref 70–99)
Potassium: 4.3 mmol/L (ref 3.5–5.2)
Sodium: 141 mmol/L (ref 134–144)
Total Protein: 7.4 g/dL (ref 6.0–8.5)
eGFR: 102 mL/min/{1.73_m2} (ref >59)

## 2021-01-29 LAB — CBC WITH DIFFERENTIAL/PLATELET
Basophils Absolute: 0 10*3/uL (ref 0.0–0.2)
Basos: 0 %
EOS (ABSOLUTE): 0.2 10*3/uL (ref 0.0–0.4)
Eos: 3 %
Hematocrit: 50 % (ref 37.5–51.0)
Hemoglobin: 16.4 g/dL (ref 13.0–17.7)
Immature Grans (Abs): 0 10*3/uL (ref 0.0–0.1)
Immature Granulocytes: 0 %
Lymphocytes Absolute: 2.2 10*3/uL (ref 0.7–3.1)
Lymphs: 30 %
MCH: 28.9 pg (ref 26.6–33.0)
MCHC: 32.8 g/dL (ref 31.5–35.7)
MCV: 88 fL (ref 79–97)
Monocytes Absolute: 0.7 10*3/uL (ref 0.1–0.9)
Monocytes: 9 %
Neutrophils Absolute: 4.3 10*3/uL (ref 1.4–7.0)
Neutrophils: 58 %
Platelets: 272 10*3/uL (ref 150–450)
RBC: 5.68 x10E6/uL (ref 4.14–5.80)
RDW: 12.9 % (ref 11.6–15.4)
WBC: 7.4 10*3/uL (ref 3.4–10.8)

## 2021-01-29 LAB — TSH: TSH: 0.404 u[IU]/mL — ABNORMAL LOW (ref 0.450–4.500)

## 2021-01-29 LAB — AMYLASE: Amylase: 82 U/L (ref 31–110)

## 2021-01-29 LAB — T4, FREE: Free T4: 1.68 ng/dL (ref 0.82–1.77)

## 2021-01-29 LAB — RPR: RPR Ser Ql: NONREACTIVE

## 2021-01-29 LAB — HIV ANTIBODY (ROUTINE TESTING W REFLEX): HIV Screen 4th Generation wRfx: NONREACTIVE

## 2021-01-29 LAB — HEPATITIS C ANTIBODY: Hep C Virus Ab: <0.1 s/co ratio (ref 0.0–0.9)

## 2021-01-29 LAB — LIPASE: Lipase: 16 U/L (ref 13–78)

## 2021-01-29 NOTE — Progress Notes (Signed)
His labs are good overall. His thyroid blood test (TSH) is a little low with a normal free T4. I recommend this be rechecked in 3-4 months. He is negative for HIV, hepatitis C and syphilis. Still waiting on his urine results.

## 2021-01-31 LAB — GC/CHLAMYDIA PROBE AMP
Chlamydia trachomatis, NAA: NEGATIVE
Neisseria Gonorrhoeae by PCR: NEGATIVE

## 2021-01-31 LAB — TRICHOMONAS VAGINALIS, PROBE AMP: Trich vag by NAA: NEGATIVE

## 2021-01-31 NOTE — Progress Notes (Signed)
Negative STD tests

## 2021-02-21 DIAGNOSIS — F4322 Adjustment disorder with anxiety: Secondary | ICD-10-CM | POA: Diagnosis not present

## 2021-02-26 ENCOUNTER — Ambulatory Visit: Payer: BC Managed Care – PPO | Admitting: Gastroenterology

## 2021-03-01 ENCOUNTER — Ambulatory Visit: Payer: BC Managed Care – PPO | Admitting: Gastroenterology

## 2021-03-01 ENCOUNTER — Encounter: Payer: Self-pay | Admitting: Gastroenterology

## 2021-03-01 ENCOUNTER — Ambulatory Visit (INDEPENDENT_AMBULATORY_CARE_PROVIDER_SITE_OTHER): Payer: BC Managed Care – PPO | Admitting: Gastroenterology

## 2021-03-01 ENCOUNTER — Other Ambulatory Visit: Payer: BC Managed Care – PPO

## 2021-03-01 VITALS — BP 104/70 | HR 74 | Ht 68.5 in | Wt 228.5 lb

## 2021-03-01 DIAGNOSIS — R112 Nausea with vomiting, unspecified: Secondary | ICD-10-CM

## 2021-03-01 DIAGNOSIS — K219 Gastro-esophageal reflux disease without esophagitis: Secondary | ICD-10-CM

## 2021-03-01 DIAGNOSIS — F1290 Cannabis use, unspecified, uncomplicated: Secondary | ICD-10-CM | POA: Diagnosis not present

## 2021-03-01 MED ORDER — PROMETHAZINE HCL 12.5 MG PO TABS: 12.5000 mg | ORAL_TABLET | Freq: Four times a day (QID) | ORAL | 0 refills | Status: DC | PRN

## 2021-03-01 NOTE — Progress Notes (Signed)
HPI :  25 year old male with a history of scoliosis, depression, acne, referred by Mack Hook, PA for symptoms of nausea and vomiting.  He states since this past July he is developed nausea and vomiting.  He states it mostly bothers him after he eats, anywhere between immediately over 1 hour after eating he will often vomit.  States when he vomits the contents it is usually just liquids and not much food and, he thinks the food stays down.  He is nauseous with this but outside of these times, usually is not nauseous outside of the morning when he wakes up.  He denies any postprandial abdominal pain.  He does have some heartburn few times per week at baseline.  He denies any dysphagia.  He started Prilosec 20 mg and takes this 3-4 times per week.  He states it is helped his reflux symptoms but has not helped his nausea or vomiting at all.  He denies any NSAID use.  He denies any blood in his stools.  No changes in his bowel habits.  He denies any alcohol use or tobacco use.  He does smoke marijuana every night for the past 4 to 5 years.  He states this helps with his anxiety.  He does state that taking a hot shower makes his symptoms of nausea and vomiting better.  Otherwise he thinks symptoms have been ongoing since he started Vraylar.  He notices the time course match up to onset of symptoms and he wonders if related as well.  He was given some Zofran to take for these symptoms.  He took it every day for 10 days and states it did not provide any benefit to his symptoms at all.  He had labs checked on October 3, CBC was normal as well as c-Met and lipase.    Past Medical History:  Diagnosis Date   Acne    Depression    Scoliosis      Past Surgical History:  Procedure Laterality Date   TYMPANOSTOMY TUBE PLACEMENT     Family History  Problem Relation Age of Onset   Diabetes Father    Colon cancer Neg Hx    Esophageal cancer Neg Hx    Stomach cancer Neg Hx    Pancreatic cancer Neg Hx     Social History   Tobacco Use   Smoking status: Never   Smokeless tobacco: Never  Vaping Use   Vaping Use: Never used  Substance Use Topics   Alcohol use: Never   Drug use: Yes    Types: Marijuana   Current Outpatient Medications  Medication Sig Dispense Refill   lamoTRIgine (LAMICTAL) 100 MG tablet Take 100 mg by mouth 2 (two) times daily.     omeprazole (PRILOSEC OTC) 20 MG tablet Take 20 mg by mouth daily.     promethazine (PHENERGAN) 12.5 MG tablet Take 1 tablet (12.5 mg total) by mouth every 6 (six) hours as needed for nausea or vomiting. 30 tablet 0   VRAYLAR 1.5 MG capsule Take 1.5 mg by mouth daily.     No current facility-administered medications for this visit.   No Known Allergies   Review of Systems: All systems reviewed and negative except where noted in HPI.   Lab Results  Component Value Date   WBC 7.4 01/28/2021   HGB 16.4 01/28/2021   HCT 50.0 01/28/2021   MCV 88 01/28/2021   PLT 272 01/28/2021    Lab Results  Component Value Date   CREATININE  1.05 01/28/2021   BUN 11 01/28/2021   NA 141 01/28/2021   K 4.3 01/28/2021   CL 102 01/28/2021   CO2 23 01/28/2021    Lab Results  Component Value Date   ALT 31 01/28/2021   AST 15 01/28/2021   ALKPHOS 82 01/28/2021   BILITOT 0.3 01/28/2021     Physical Exam: BP 104/70   Pulse 74   Ht 5' 8.5" (1.74 m)   Wt 228 lb 8 oz (103.6 kg)   SpO2 98%   BMI 34.24 kg/m  Constitutional: Pleasant,well-developed, male in no acute distress. HEENT: Normocephalic and atraumatic. Conjunctivae are normal. No scleral icterus. Neck supple.  Cardiovascular: Normal rate, regular rhythm.  Pulmonary/chest: Effort normal and breath sounds normal.  Abdominal: Soft, nondistended, nontender.  There are no masses palpable.  Extremities: no edema Lymphadenopathy: No cervical adenopathy noted. Neurological: Alert and oriented to person place and time. Skin: Skin is warm and dry. No rashes noted. Psychiatric: Normal  mood and affect. Behavior is normal.   ASSESSMENT AND PLAN: 25 year old male here for new patient assessment the following:  Nausea and vomiting GERD Marijuana use  Reviewed his symptoms as outlined above.  Since July he has had significant postprandial nausea and vomiting, and some intermittent nausea outside the eating times.  Prilosec controls his baseline reflux symptoms but has not helped reduce the nausea vomiting at all.  Trials of Zofran have been unsuccessful.  He has had labs done recently which are reassuring.  He smokes a significant amount of marijuana on a frequent basis, symptoms are improved using hot showers, he definitely could have a component of cannabinoid hyperemesis syndrome.  I counseled him on what this is and recommend complete abstinence of marijuana given the symptoms to see if it helps.  Provided him some handouts about cannabinoid hyperemesis syndrome.  He agrees with this and will try to minimize its use.  Otherwise it is possible the Vraylar could also be a component of his symptoms as well.  He thinks his symptoms started around the same time he started this regimen.  I recommend he discuss this with his behavioral health team and see if he can stop the Vraylar and see if this will make any benefit in his symptoms as well.  For now he will continue the omeprazole at this is controlling his reflux, and will otherwise add some Phenergan as needed to see if that will help some nausea when he gets it.  I will otherwise screen for H. pylori with an IgG serology and if positive will treat.  Hopefully stopping marijuana will minimize his symptoms.  If not hopefully holding off on Vraylar might help as well.  If his symptoms despite these measures after the next month or so he should contact me, and in that setting we will pursue an EGD.  We will hold off on endoscopy at this time while we await measures as above.  Plan: - continue omeprazole - trial of phenergan PRN - H  pylori IgG serology - stop all marijuana for now, handout given regarding possible cannabinoid hyperemesis syndrome - patient will speak with his mental health team about transitioning from [place] to something else, if related to his symptoms - call me in 1 month if no improvement with these measures, will consider EGD  Jolly Mango, MD [facility]  CC: Girtha Rm, PA-C

## 2021-03-01 NOTE — Patient Instructions (Addendum)
If you are age 23 or older, your body mass index should be between 23-30. Your Body mass index is 34.24 kg/m. If this is out of the aforementioned range listed, please consider follow up with your Primary Care Provider.  If you are age 41 or younger, your body mass index should be between 19-25. Your Body mass index is 34.24 kg/m. If this is out of the aformentioned range listed, please consider follow up with your Primary Care Provider.   ________________________________________________________  The [facility] providers would like to encourage you to use [facility] to communicate with providers for non-urgent requests or questions.  Due to long hold times on the telephone, sending your provider a message by [facility] may be a faster and more efficient way to get a response.  Please allow 48 business hours for a response.  Please remember that this is for non-urgent requests.  _______________________________________________________  Please go to the lab in the basement of our building to have lab work done as you leave today. Hit "B" for basement when you get on the elevator.  When the doors open the lab is on your left.  We will call you with the results. Thank you.  Continue omeprazole.  We have sent the following medications to your pharmacy for you to pick up at your convenience: Phenergan 12.5 mg: Take every 8 hours as needed  Please discontinue marijuana use. We are giving you a handout today regarding Cannabinoid Hyperemesis Syndrome.  Please discuss stopping Vraylar with psychologist.  Please call of MyChart in a month with an update.  Thank you for entrusting me with your care and for choosing [facility], Dr. Ileene Patrick

## 2021-03-19 DIAGNOSIS — J351 Hypertrophy of tonsils: Secondary | ICD-10-CM | POA: Diagnosis not present

## 2021-03-19 DIAGNOSIS — K219 Gastro-esophageal reflux disease without esophagitis: Secondary | ICD-10-CM

## 2021-03-19 DIAGNOSIS — R1110 Vomiting, unspecified: Secondary | ICD-10-CM | POA: Diagnosis not present

## 2021-03-19 HISTORY — DX: Gastro-esophageal reflux disease without esophagitis: K21.9

## 2021-03-28 DIAGNOSIS — F39 Unspecified mood [affective] disorder: Secondary | ICD-10-CM | POA: Diagnosis not present

## 2021-04-10 DIAGNOSIS — F4322 Adjustment disorder with anxiety: Secondary | ICD-10-CM | POA: Diagnosis not present

## 2021-04-10 DIAGNOSIS — F900 Attention-deficit hyperactivity disorder, predominantly inattentive type: Secondary | ICD-10-CM | POA: Diagnosis not present

## 2021-04-24 DIAGNOSIS — F4322 Adjustment disorder with anxiety: Secondary | ICD-10-CM | POA: Diagnosis not present

## 2021-04-24 DIAGNOSIS — F900 Attention-deficit hyperactivity disorder, predominantly inattentive type: Secondary | ICD-10-CM | POA: Diagnosis not present

## 2021-05-08 DIAGNOSIS — F39 Unspecified mood [affective] disorder: Secondary | ICD-10-CM | POA: Diagnosis not present

## 2021-05-22 DIAGNOSIS — F39 Unspecified mood [affective] disorder: Secondary | ICD-10-CM | POA: Diagnosis not present

## 2021-05-22 DIAGNOSIS — F900 Attention-deficit hyperactivity disorder, predominantly inattentive type: Secondary | ICD-10-CM | POA: Diagnosis not present

## 2021-05-22 DIAGNOSIS — F4322 Adjustment disorder with anxiety: Secondary | ICD-10-CM | POA: Diagnosis not present

## 2021-07-04 DIAGNOSIS — F900 Attention-deficit hyperactivity disorder, predominantly inattentive type: Secondary | ICD-10-CM | POA: Diagnosis not present

## 2021-07-04 DIAGNOSIS — F4322 Adjustment disorder with anxiety: Secondary | ICD-10-CM | POA: Diagnosis not present

## 2021-07-11 DIAGNOSIS — F4322 Adjustment disorder with anxiety: Secondary | ICD-10-CM | POA: Diagnosis not present

## 2021-07-11 DIAGNOSIS — F900 Attention-deficit hyperactivity disorder, predominantly inattentive type: Secondary | ICD-10-CM | POA: Diagnosis not present

## 2021-08-12 NOTE — Progress Notes (Signed)
? ?  Established Patient Office Visit ? ?Subjective:  ?Patient ID: Elijah [name], male    DOB: 1996/03/02  Age: 26 y.o. MRN: [id number] ? ?CC:  ?Chief Complaint  ?Patient presents with  ? Follow-up  ?  Follow up on Thyroid levels. Wants to see if he can get an ENT referral for snoring.  ? ? ?HPI ?Elijah [name] presents for follow up abnormal thyroid labs; denies dry skin or hair loss; also reports snoring problems for years; often wakes up with very dry mouth and throat are dry; has been told that he has breathing troubles when he sleeps; requests to have a sleep study. ? ?Outpatient Medications Prior to Visit  ?Medication Sig Dispense Refill  ? promethazine (PHENERGAN) 12.5 MG tablet Take 1 tablet (12.5 mg total) by mouth every 6 (six) hours as needed for nausea or vomiting. 30 tablet 0  ? VRAYLAR 1.5 MG capsule Take 1.5 mg by mouth daily.    ? lamoTRIgine (LAMICTAL) 100 MG tablet Take 100 mg by mouth 2 (two) times daily. (Patient not taking: Reported on 08/13/2021)    ? omeprazole (PRILOSEC OTC) 20 MG tablet Take 20 mg by mouth daily. (Patient not taking: Reported on 08/13/2021)    ? ?No facility-administered medications prior to visit.  ? ? ?No Known Allergies ? ?ROS ?Review of Systems ? ?  ?Objective:  ?  ?Physical Exam ? ?BP 120/80   Pulse 63   Wt 238 lb 9.6 oz (108.2 kg)   SpO2 97%   BMI 35.75 kg/m?  ? ?Wt Readings from Last 3 Encounters:  ?08/13/21 238 lb 9.6 oz (108.2 kg)  ?03/01/21 228 lb 8 oz (103.6 kg)  ?01/28/21 218 lb 6.4 oz (99.1 kg)  ? ? ?Results for orders placed or performed in visit on 08/13/21  ?Thyroid Panel With TSH  ?Result Value Ref Range  ? TSH 0.371 (L) 0.450 - 4.500 uIU/mL  ? T4, Total 8.0 4.5 - 12.0 ug/dL  ? T3 Uptake Ratio 33 24 - 39 %  ? Free Thyroxine Index 2.6 1.2 - 4.9  ?  ? ? ? ?The ASCVD Risk score (Arnett DK, et al., 2019) failed to calculate for the following reasons: ?  The 2019 ASCVD risk score is only valid for ages 62 to 59 ? ?  ?Assessment & Plan:  ? ?Problem List Items  Addressed This Visit   ? ?  ? Digestive  ? GERD without esophagitis  ?  ? Other  ? Snoring  ? Relevant Orders  ? Ambulatory referral to Sleep Studies  ? Abnormal TSH - Primary  ? Relevant Orders  ? Thyroid Panel With TSH (Completed)  ? ? ?No orders of the defined types were placed in this encounter. ? ? ?Follow-up: Return in about 6 months (around 01/30/2022) for Return for Annual Exam with PCP Mayford Knife.  ? ? ?Jake Shark, PA-C ?

## 2021-08-13 ENCOUNTER — Ambulatory Visit: Payer: BC Managed Care – PPO | Admitting: Physician Assistant

## 2021-08-13 ENCOUNTER — Encounter: Payer: Self-pay | Admitting: Physician Assistant

## 2021-08-13 VITALS — BP 120/80 | HR 63 | Ht 68.5 in | Wt 238.6 lb

## 2021-08-13 DIAGNOSIS — R7989 Other specified abnormal findings of blood chemistry: Secondary | ICD-10-CM | POA: Diagnosis not present

## 2021-08-13 DIAGNOSIS — K219 Gastro-esophageal reflux disease without esophagitis: Secondary | ICD-10-CM

## 2021-08-13 DIAGNOSIS — E038 Other specified hypothyroidism: Secondary | ICD-10-CM | POA: Diagnosis not present

## 2021-08-13 DIAGNOSIS — R0683 Snoring: Secondary | ICD-10-CM

## 2021-08-13 NOTE — Patient Instructions (Signed)
You will get a call to schedule an appointment for a sleep study ? ?

## 2021-08-14 ENCOUNTER — Encounter: Payer: Self-pay | Admitting: Physician Assistant

## 2021-08-14 LAB — THYROID PANEL WITH TSH
Free Thyroxine Index: 2.6 (ref 1.2–4.9)
T3 Uptake Ratio: 33 % (ref 24–39)
T4, Total: 8 ug/dL (ref 4.5–12.0)
TSH: 0.371 u[IU]/mL — ABNORMAL LOW (ref 0.450–4.500)

## 2021-08-14 NOTE — Assessment & Plan Note (Signed)
stable, continue OTC Prilosec / Omeprazole, avoid stomach irritants (tomatoes, oranges, lemons, limes, spicy/greasy food) ? ?

## 2021-08-14 NOTE — Assessment & Plan Note (Signed)
Will refer for a sleep study ?

## 2021-08-14 NOTE — Assessment & Plan Note (Signed)
Stable, will check thyroid panel with TSH ?

## 2021-08-15 ENCOUNTER — Other Ambulatory Visit: Payer: Self-pay | Admitting: Physician Assistant

## 2021-08-15 DIAGNOSIS — E038 Other specified hypothyroidism: Secondary | ICD-10-CM

## 2021-08-15 MED ORDER — LEVOTHYROXINE SODIUM 25 MCG PO TABS: 25.0000 ug | ORAL_TABLET | Freq: Every day | ORAL | 3 refills | Status: DC

## 2021-08-15 NOTE — Assessment & Plan Note (Signed)
If continues to be asymptomatic, then treatment not needed; otherwise can start levothyroxine 25 mcg daily ?

## 2021-08-19 DIAGNOSIS — F22 Delusional disorders: Secondary | ICD-10-CM | POA: Diagnosis not present

## 2021-08-19 DIAGNOSIS — F4010 Social phobia, unspecified: Secondary | ICD-10-CM | POA: Diagnosis not present

## 2021-08-21 DIAGNOSIS — F4322 Adjustment disorder with anxiety: Secondary | ICD-10-CM | POA: Diagnosis not present

## 2021-08-21 DIAGNOSIS — F900 Attention-deficit hyperactivity disorder, predominantly inattentive type: Secondary | ICD-10-CM | POA: Diagnosis not present

## 2021-09-05 DIAGNOSIS — F900 Attention-deficit hyperactivity disorder, predominantly inattentive type: Secondary | ICD-10-CM | POA: Diagnosis not present

## 2021-09-05 DIAGNOSIS — F4322 Adjustment disorder with anxiety: Secondary | ICD-10-CM | POA: Diagnosis not present

## 2021-09-12 DIAGNOSIS — F4322 Adjustment disorder with anxiety: Secondary | ICD-10-CM | POA: Diagnosis not present

## 2021-09-12 DIAGNOSIS — F900 Attention-deficit hyperactivity disorder, predominantly inattentive type: Secondary | ICD-10-CM | POA: Diagnosis not present

## 2021-09-18 DIAGNOSIS — F900 Attention-deficit hyperactivity disorder, predominantly inattentive type: Secondary | ICD-10-CM | POA: Diagnosis not present

## 2021-09-18 DIAGNOSIS — F4322 Adjustment disorder with anxiety: Secondary | ICD-10-CM | POA: Diagnosis not present

## 2021-09-19 ENCOUNTER — Other Ambulatory Visit: Payer: Self-pay | Admitting: *Deleted

## 2021-09-19 DIAGNOSIS — R0683 Snoring: Secondary | ICD-10-CM

## 2021-09-20 DIAGNOSIS — F22 Delusional disorders: Secondary | ICD-10-CM | POA: Diagnosis not present

## 2021-09-20 DIAGNOSIS — F4010 Social phobia, unspecified: Secondary | ICD-10-CM | POA: Diagnosis not present

## 2021-09-20 DIAGNOSIS — F332 Major depressive disorder, recurrent severe without psychotic features: Secondary | ICD-10-CM | POA: Diagnosis not present

## 2021-09-25 ENCOUNTER — Other Ambulatory Visit: Payer: BC Managed Care – PPO

## 2021-09-25 DIAGNOSIS — E038 Other specified hypothyroidism: Secondary | ICD-10-CM

## 2021-09-25 DIAGNOSIS — F4322 Adjustment disorder with anxiety: Secondary | ICD-10-CM | POA: Diagnosis not present

## 2021-09-25 DIAGNOSIS — F900 Attention-deficit hyperactivity disorder, predominantly inattentive type: Secondary | ICD-10-CM | POA: Diagnosis not present

## 2021-09-26 LAB — THYROID PANEL WITH TSH
Free Thyroxine Index: 3.3 (ref 1.2–4.9)
T3 Uptake Ratio: 34 % (ref 24–39)
T4, Total: 9.7 ug/dL (ref 4.5–12.0)
TSH: 0.448 u[IU]/mL — ABNORMAL LOW (ref 0.450–4.500)

## 2021-09-29 NOTE — Progress Notes (Signed)
Thyroid level is better. Continue levothyroxine 25 mcg daily.

## 2021-09-30 DIAGNOSIS — F900 Attention-deficit hyperactivity disorder, predominantly inattentive type: Secondary | ICD-10-CM | POA: Diagnosis not present

## 2021-09-30 DIAGNOSIS — F4322 Adjustment disorder with anxiety: Secondary | ICD-10-CM | POA: Diagnosis not present

## 2021-10-04 DIAGNOSIS — F4322 Adjustment disorder with anxiety: Secondary | ICD-10-CM | POA: Diagnosis not present

## 2021-10-04 DIAGNOSIS — F900 Attention-deficit hyperactivity disorder, predominantly inattentive type: Secondary | ICD-10-CM | POA: Diagnosis not present

## 2021-10-16 DIAGNOSIS — F900 Attention-deficit hyperactivity disorder, predominantly inattentive type: Secondary | ICD-10-CM | POA: Diagnosis not present

## 2021-10-16 DIAGNOSIS — F4322 Adjustment disorder with anxiety: Secondary | ICD-10-CM | POA: Diagnosis not present

## 2021-10-21 ENCOUNTER — Telehealth: Payer: Self-pay | Admitting: Physician Assistant

## 2021-10-21 DIAGNOSIS — F900 Attention-deficit hyperactivity disorder, predominantly inattentive type: Secondary | ICD-10-CM | POA: Diagnosis not present

## 2021-10-21 DIAGNOSIS — F333 Major depressive disorder, recurrent, severe with psychotic symptoms: Secondary | ICD-10-CM | POA: Diagnosis not present

## 2021-10-22 ENCOUNTER — Other Ambulatory Visit: Payer: Self-pay | Admitting: Physician Assistant

## 2021-10-22 DIAGNOSIS — K219 Gastro-esophageal reflux disease without esophagitis: Secondary | ICD-10-CM

## 2021-10-23 DIAGNOSIS — F4322 Adjustment disorder with anxiety: Secondary | ICD-10-CM | POA: Diagnosis not present

## 2021-10-23 DIAGNOSIS — F900 Attention-deficit hyperactivity disorder, predominantly inattentive type: Secondary | ICD-10-CM | POA: Diagnosis not present

## 2021-10-25 NOTE — Telephone Encounter (Signed)
done

## 2021-10-30 DIAGNOSIS — F4322 Adjustment disorder with anxiety: Secondary | ICD-10-CM | POA: Diagnosis not present

## 2021-10-30 DIAGNOSIS — F900 Attention-deficit hyperactivity disorder, predominantly inattentive type: Secondary | ICD-10-CM | POA: Diagnosis not present

## 2021-11-25 ENCOUNTER — Ambulatory Visit (HOSPITAL_BASED_OUTPATIENT_CLINIC_OR_DEPARTMENT_OTHER): Payer: BC Managed Care – PPO | Attending: Physician Assistant | Admitting: Internal Medicine

## 2021-11-25 VITALS — Ht 70.0 in | Wt 220.0 lb

## 2021-11-25 DIAGNOSIS — G4733 Obstructive sleep apnea (adult) (pediatric): Secondary | ICD-10-CM | POA: Diagnosis not present

## 2021-11-25 DIAGNOSIS — R0683 Snoring: Secondary | ICD-10-CM | POA: Diagnosis not present

## 2021-12-01 DIAGNOSIS — R0683 Snoring: Secondary | ICD-10-CM

## 2021-12-01 NOTE — Procedures (Signed)
     Patient Name: Elijah [name], Elijah [name] Date: 11/26/2021 Gender: Male D.O.B: 18-Jan-1996 Age (years): 25 Referring Provider: Jake Shark Height (inches): 70 Interpreting Physician: Jetty Duhamel MD, ABSM Weight (lbs): 220 RPSGT: [place] BMI: 32 MRN: [id number] Neck Size: 17.50  CLINICAL INFORMATION Sleep Study Type: HST Indication for sleep study: snoring Epworth Sleepiness Score: 11  SLEEP STUDY TECHNIQUE A multi-channel overnight portable sleep study was performed. The channels recorded were: nasal airflow, thoracic respiratory movement, and oxygen saturation with a pulse oximetry. Snoring was also monitored.  MEDICATIONS Patient self administered medications include: none reported.  SLEEP ARCHITECTURE Patient was studied for 357.5 minutes. The sleep efficiency was 100.0 % and the patient was supine for 0%. The arousal index was 0.0 per hour.  RESPIRATORY PARAMETERS The overall AHI was 88.3 per hour, with a central apnea index of 0 per hour. The oxygen nadir was 79% during sleep.  CARDIAC DATA Mean heart rate during sleep was 75.3 bpm.  IMPRESSIONS - Severe obstructive sleep apnea occurred during this study (AHI = 88.3/h). - Oxygen desaturation was noted during this study (Min O2 = 79%). Mean 94% - Patient snored.  DIAGNOSIS - Obstructive Sleep Apnea (G47.33)  RECOMMENDATIONS - Suggest CPAP titration sleep study or autopap. Other options would be based on clinical judgment. - Be careful with alcohol, sedatives and other CNS depressants that may worsen sleep apnea and disrupt normal sleep architecture. - Sleep hygiene should be reviewed to assess factors that may improve sleep quality. - Weight management and regular exercise should be initiated or continued.  [Electronically signed] 12/01/2021 12:05 PM  Jetty Duhamel MD, ABSM Diplomate, American Board of Sleep Medicine NPI: [id number]                        Jetty Duhamel Diplomate, American Board of Sleep Medicine  ELECTRONICALLY SIGNED ON:  12/01/2021, 11:56 AM [facility] PH: [phone]   FX: [phone] ACCREDITED BY THE AMERICAN ACADEMY OF SLEEP MEDICINE

## 2021-12-03 NOTE — Progress Notes (Signed)
Lvm for pt to call back and schedule a virtual visit or in office. KH

## 2021-12-04 NOTE — Progress Notes (Signed)
Appt was made. KH 

## 2021-12-11 ENCOUNTER — Ambulatory Visit: Payer: BC Managed Care – PPO | Admitting: Medical

## 2021-12-11 ENCOUNTER — Telehealth: Payer: Self-pay | Admitting: Medical

## 2021-12-11 NOTE — Telephone Encounter (Signed)

## 2021-12-23 ENCOUNTER — Ambulatory Visit: Payer: BC Managed Care – PPO | Admitting: Medical

## 2021-12-23 ENCOUNTER — Encounter: Payer: Self-pay | Admitting: Medical

## 2021-12-23 VITALS — BP 112/74 | HR 60 | Temp 98.3°F | Wt 238.2 lb

## 2021-12-23 DIAGNOSIS — R0683 Snoring: Secondary | ICD-10-CM

## 2021-12-23 DIAGNOSIS — G478 Other sleep disorders: Secondary | ICD-10-CM

## 2021-12-23 DIAGNOSIS — J351 Hypertrophy of tonsils: Secondary | ICD-10-CM

## 2021-12-23 DIAGNOSIS — G4733 Obstructive sleep apnea (adult) (pediatric): Secondary | ICD-10-CM

## 2021-12-23 DIAGNOSIS — R5383 Other fatigue: Secondary | ICD-10-CM | POA: Insufficient documentation

## 2021-12-23 NOTE — Progress Notes (Signed)
Subjective:  Elijah [name] is a 26 y.o. male who presents for Chief Complaint  Patient presents with   other    F/u on sleep study      Here with his mother today to discuss sleep study results.  He just recently completed a sleep study.  It took months to study to take place unfortunately so he has been waiting for this for a while.  He does endorse snoring, not sleeping well, sore throat and dry mouth, thinks he sleeps with his mouth open, fatigue, daytime somnolence.  He saw ear nose and throat Dr. Annalee [name] 6 months ago for big tonsils and concerns about vomiting up.  Quickly after eating but this was attributed to a medication for ADD.Marland Kitchen  At that time they did not think he needed any type of surgery for tonsils.  He ended up stopping the medication for ADD which resolved the issue.  He also ended up seeing gastroenterology for the same issues.  No other aggravating or relieving factors.    No other c/o.  Past Medical History:  Diagnosis Date   Acne    Depression    Gastroesophageal reflux disease without esophagitis 03/19/2021   Scoliosis    Current Outpatient Medications on File Prior to Visit  Medication Sig Dispense Refill   levothyroxine (SYNTHROID) 25 MCG tablet Take 1 tablet (25 mcg total) by mouth daily. 30 tablet 3   promethazine (PHENERGAN) 12.5 MG tablet Take 1 tablet (12.5 mg total) by mouth every 6 (six) hours as needed for nausea or vomiting. (Patient not taking: Reported on 12/23/2021) 30 tablet 0   VRAYLAR 1.5 MG capsule Take 1.5 mg by mouth daily.     No current facility-administered medications on file prior to visit.     The following portions of the patient's history were reviewed and updated as appropriate: allergies, current medications, past family history, past medical history, past social history, past surgical history and problem list.  ROS Otherwise as in subjective above  Objective: BP 112/74   Pulse 60   Temp 98.3 F (36.8 C)   Wt 238 lb 3.2  oz (108 kg)   BMI 34.18 kg/m   General appearance: alert, no distress, well developed, well nourished HEENT: normocephalic, sclerae anicteric, conjunctiva pink and moist, TMs pearly, nares patent, no discharge or erythema, pharynx normal Oral cavity: MMM, no lesions ,moderate sized tonsils Neck: supple, no lymphadenopathy, no thyromegaly, no masses   Assessment: Encounter Diagnoses  Name Primary?   Tonsillar hypertrophy Yes   Snoring    Other fatigue    Non-restorative sleep    OSA (obstructive sleep apnea)      Plan: We discussed the results of his sleep study showing severe sleep apnea with AHI 88/h, minimum oxygen 79%, mean oxygen 94%.  We discussed potential complications associated with sleep apnea uncontrolled  We discussed potential treatments for sleep apnea, we discussed not sleeping supine, avoiding sedatives and alcohol and eating, discussed the need to lose weight.  We will send for titration study.  We discussed weight loss medication that could benefit his efforts.  Counseled on diet and exercise.  Follow-up pending titration studies.  Declines medication to help with weight loss for now.    Elijah [name] was seen today for other.  Diagnoses and all orders for this visit:  Tonsillar hypertrophy  Snoring  Other fatigue  Non-restorative sleep  OSA (obstructive sleep apnea) -     Ambulatory referral to Sleep Studies   Follow up: pending  titration study

## 2021-12-25 ENCOUNTER — Telehealth: Payer: Self-pay | Admitting: Medical

## 2021-12-25 NOTE — Telephone Encounter (Signed)
Patient's mother left voice mail, she is wanting to speak to someone about the titration study Elijah [name] recommended And the insurance authorization

## 2021-12-26 ENCOUNTER — Other Ambulatory Visit: Payer: Self-pay | Admitting: *Deleted

## 2021-12-26 DIAGNOSIS — G4733 Obstructive sleep apnea (adult) (pediatric): Secondary | ICD-10-CM

## 2022-01-01 ENCOUNTER — Encounter: Payer: Self-pay | Admitting: Internal Medicine

## 2022-01-09 ENCOUNTER — Telehealth: Payer: Self-pay | Admitting: Medical

## 2022-01-09 DIAGNOSIS — F900 Attention-deficit hyperactivity disorder, predominantly inattentive type: Secondary | ICD-10-CM | POA: Diagnosis not present

## 2022-01-09 DIAGNOSIS — F4322 Adjustment disorder with anxiety: Secondary | ICD-10-CM | POA: Diagnosis not present

## 2022-01-09 NOTE — Telephone Encounter (Signed)
Pt left message he would like to try the weight loss medication injection that yall discussed at last OV.  Left pt a message that you were out of the office til Monday

## 2022-01-10 ENCOUNTER — Other Ambulatory Visit: Payer: Self-pay | Admitting: Medical

## 2022-01-10 MED ORDER — WEGOVY 0.25 MG/0.5ML ~~LOC~~ SOAJ: 0.2500 mg | SUBCUTANEOUS | 0 refills | Status: DC

## 2022-01-14 ENCOUNTER — Telehealth: Payer: Self-pay | Admitting: Medical

## 2022-01-14 NOTE — Telephone Encounter (Signed)
P.A. Mancel Parsons completed & Faxed records for P.A.

## 2022-01-15 NOTE — Telephone Encounter (Signed)
Elijah [name] has now been approved til 07/15/22, sent mychart message

## 2022-01-20 ENCOUNTER — Ambulatory Visit (HOSPITAL_BASED_OUTPATIENT_CLINIC_OR_DEPARTMENT_OTHER): Payer: BC Managed Care – PPO | Attending: Medical | Admitting: Internal Medicine

## 2022-01-20 ENCOUNTER — Telehealth: Payer: Self-pay | Admitting: Medical

## 2022-01-20 VITALS — Ht 70.0 in | Wt 230.0 lb

## 2022-01-20 DIAGNOSIS — G4733 Obstructive sleep apnea (adult) (pediatric): Secondary | ICD-10-CM | POA: Diagnosis not present

## 2022-01-20 NOTE — Telephone Encounter (Signed)
Pt left message about Wegovy, called him back, he hadn't read his mychart message and gave info about approval and instructions

## 2022-01-22 ENCOUNTER — Telehealth: Payer: Self-pay | Admitting: Medical

## 2022-01-22 DIAGNOSIS — F900 Attention-deficit hyperactivity disorder, predominantly inattentive type: Secondary | ICD-10-CM | POA: Diagnosis not present

## 2022-01-22 DIAGNOSIS — F4322 Adjustment disorder with anxiety: Secondary | ICD-10-CM | POA: Diagnosis not present

## 2022-01-22 NOTE — Telephone Encounter (Signed)
Pt called and states that wegovy is on national backorder and he would like to know what he can do. Can he have a different medication? Pt can be reached at [phone].

## 2022-01-22 NOTE — Telephone Encounter (Signed)
Pt was notified and he will check with pharmacy to find out if covered .  Pt had titration done on Monday

## 2022-01-25 DIAGNOSIS — G4733 Obstructive sleep apnea (adult) (pediatric): Secondary | ICD-10-CM | POA: Diagnosis not present

## 2022-01-25 NOTE — Procedures (Signed)
     Patient Name: Elijah, [name] Date: 01/20/2022 Gender: Male D.O.B: 1995/07/08 Age (years): 26 Referring Provider: Chana Bode Height (inches): 70 Interpreting Physician: Baird Lyons MD, ABSM Weight (lbs): 230 RPSGT: Elijah [name] BMI: 33 MRN: [id number] Neck Size: 16.00  CLINICAL INFORMATION The patient is referred for a BiPAP titration to treat sleep apnea.  Date of NPSG, Split Night or HST: HST 12/02/21- AHI 88.3/ hr, desaturation to 79%, body weight 220 lbs  SLEEP STUDY TECHNIQUE As per the AASM Manual for the Scoring of Sleep and Associated Events v2.3 (April 2016) with a hypopnea requiring 4% desaturations.  The channels recorded and monitored were frontal, central and occipital EEG, electrooculogram (EOG), submentalis EMG (chin), nasal and oral airflow, thoracic and abdominal wall motion, anterior tibialis EMG, snore microphone, electrocardiogram, and pulse oximetry. Bilevel positive airway pressure (BPAP) was initiated at the beginning of the study and titrated to treat sleep-disordered breathing.  MEDICATIONS Medications self-administered by patient taken the night of the study : none reported  RESPIRATORY PARAMETERS Optimal IPAP Pressure (cm): 26 AHI at Optimal Pressure (/hr) 11.6 Optimal EPAP Pressure (cm): 22   Overall Minimal O2 (%): 89.0 Minimal O2 at Optimal Pressure (%): 93.0 SLEEP ARCHITECTURE Start Time: 10:12:52 PM Stop Time: 5:06:17 AM Total Time (min): 413.4 Total Sleep Time (min): 283.5 Sleep Latency (min): 24.4 Sleep Efficiency (%): 68.6% REM Latency (min): 182.0 WASO (min): 105.5 Stage N1 (%): 26.6% Stage N2 (%): 45.3% Stage N3 (%): 13.9% Stage R (%): 14.1 Supine (%): 67.25 Arousal Index (/hr): 59.9   CARDIAC DATA The 2 lead EKG demonstrated sinus rhythm. The mean heart rate was 66.7 beats per minute. Other EKG findings include: None.  LEG MOVEMENT DATA The total Periodic Limb Movements of Sleep (PLMS) were 0. The PLMS index was 0.0. A  PLMS index of <15 is considered normal in adults.  IMPRESSIONS - CPAP titration was insufficient at tolerated pressures and was changed to BILEVEL/ BIPAP titration. - An optimal BIPAP pressure was selected for this patient ( 26 / 22 cm of water).  - Note that commercially available BIPAP machines have maximum pressure 25 cwp. - Mild Central Sleep Apnea was noted during this titration (CAI = 5.7/h). - Mild oxygen desaturations were observed during this titration (min O2 = 89.0%). Minimum O2 saturation on BIPAP 24/20 was 89%. - The patient snored with soft snoring volume. - No cardiac abnormalities were observed during this study. - Clinically significant periodic limb movements were not noted during this study. Arousals associated with PLMs were rare.  DIAGNOSIS - Obstructive Sleep Apnea (G47.33)  RECOMMENDATIONS - Trial of BiPAP therapy on 25/22 cm H2O or autoBIPAP 10-25/ 10-25, PS3. - Patient used an Extra Small size [employer] Full Face Evora Full mask and heated humidification. - Be careful with alcohol, sedatives and other CNS depressants that may worsen sleep apnea and disrupt normal sleep architecture. - Sleep hygiene should be reviewed to assess factors that may improve sleep quality. - Weight management and regular exercise should be initiated or continued.  [Electronically signed] 01/25/2022 12:47 PM  Baird Lyons MD, [place], American Board of Sleep Medicine NPI: [id number]                        [place], [place] [facility]  ELECTRONICALLY SIGNED ON:  01/25/2022, 12:41 PM [place] PH: [phone]   FX: [phone] [place]

## 2022-01-29 ENCOUNTER — Telehealth: Payer: Self-pay | Admitting: Medical

## 2022-01-29 NOTE — Telephone Encounter (Signed)
Please review his sleep study from 01/22/22

## 2022-01-29 NOTE — Telephone Encounter (Signed)
Pt would like you to call about his sleep study and cpap.

## 2022-01-30 ENCOUNTER — Encounter: Payer: Self-pay | Admitting: Internal Medicine

## 2022-01-30 NOTE — Progress Notes (Signed)
Left message for pt to call me back  Also faxed over order to [facility] for Bipap machine for pt

## 2022-01-31 ENCOUNTER — Encounter: Payer: BC Managed Care – PPO | Admitting: Medical

## 2022-02-07 ENCOUNTER — Encounter: Payer: BC Managed Care – PPO | Admitting: Medical

## 2022-02-07 DIAGNOSIS — G4733 Obstructive sleep apnea (adult) (pediatric): Secondary | ICD-10-CM | POA: Diagnosis not present

## 2022-02-12 DIAGNOSIS — F900 Attention-deficit hyperactivity disorder, predominantly inattentive type: Secondary | ICD-10-CM | POA: Diagnosis not present

## 2022-02-12 DIAGNOSIS — F4322 Adjustment disorder with anxiety: Secondary | ICD-10-CM | POA: Diagnosis not present

## 2022-02-19 ENCOUNTER — Ambulatory Visit: Payer: BC Managed Care – PPO | Admitting: Medical

## 2022-02-19 VITALS — BP 120/80 | HR 91 | Wt 239.4 lb

## 2022-02-19 DIAGNOSIS — R220 Localized swelling, mass and lump, head: Secondary | ICD-10-CM

## 2022-02-19 DIAGNOSIS — G4733 Obstructive sleep apnea (adult) (pediatric): Secondary | ICD-10-CM

## 2022-02-19 DIAGNOSIS — R0683 Snoring: Secondary | ICD-10-CM | POA: Diagnosis not present

## 2022-02-19 DIAGNOSIS — Z789 Other specified health status: Secondary | ICD-10-CM

## 2022-02-19 DIAGNOSIS — E038 Other specified hypothyroidism: Secondary | ICD-10-CM

## 2022-02-19 DIAGNOSIS — J351 Hypertrophy of tonsils: Secondary | ICD-10-CM

## 2022-02-19 MED ORDER — FLUTICASONE PROPIONATE 50 MCG/ACT NA SUSP: 2.0000 | Freq: Every day | NASAL | 1 refills | Status: AC

## 2022-02-19 NOTE — Progress Notes (Signed)
Subjective:  Elijah [name] is a 26 y.o. male who presents for Chief Complaint  Patient presents with   discuss issues    Discuss stomach issues- bipap is causing stomach issues where is leaking air into the stomach. [facility] possibly need a nasal mask but has nasal issues     Here with mother to discuss sleep apnea.  He recently had sleep study showing severe sleep apnea and a overnight titration study.  He just started using BiPAP a little over a week ago.  Already he is having difficulty.  He is using the device and the data states that it is effective per home health, however he is getting a lot of stomach discomfort and gas from air leaks.  He feels like he needs to do something different.  They want to use a nasal mask, however he has had chronic problems with his nose being stopped up and congested.  He does not feel like the nasal mask would work either.  He is scheduled to see ENT the first week of November for consult.  No hx/o a lot of allergies.  Not using any allergy medicaiton  He is on medicaiton for subclinical hypothyroidism but his compliance is not the best.    He talked with [facility] rep Aundra Millet a few days ago.  No other aggravating or relieving factors.    No other c/o.  Past Medical History:  Diagnosis Date   Acne    Depression    Gastroesophageal reflux disease without esophagitis 03/19/2021   Scoliosis    Current Outpatient Medications on File Prior to Visit  Medication Sig Dispense Refill   levothyroxine (SYNTHROID) 25 MCG tablet Take 1 tablet (25 mcg total) by mouth daily. 30 tablet 3   No current facility-administered medications on file prior to visit.   The following portions of the patient's history were reviewed and updated as appropriate: allergies, current medications, past family history, past medical history, past social history, past surgical history and problem list.  ROS Otherwise as in subjective above  Objective: BP 120/80   Pulse 91    Wt 239 lb 6.4 oz (108.6 kg)   BMI 34.35 kg/m   General appearance: alert, no distress, well developed, well nourished HEENT: normocephalic, sclerae anicteric, conjunctiva pink and moist, TMs pearly, nares with bilat turbinated edema, worse right, no discharge or erythema, pharynx normal Oral cavity: MMM, no lesions Neck: supple, no lymphadenopathy, no thyromegaly, no masses     Assessment: Encounter Diagnoses  Name Primary?   Difficulty with BiPAP use Yes   OSA (obstructive sleep apnea)    Swollen nostril    Snoring    Tonsillar hypertrophy    Subclinical hypothyroidism      Plan: OSA, severe.  Not tolerating BiPap after 1 week of use, causing excess gas in stomach and bowels, causing nausea.  I called and discussed with [facility].   They will consider options and call me back.   We will likely have them adjust pressure lower for now and maybe try different mask.   We are going to have him use Flonase nasal for now daily.  Continue efforts to lose weight.  F/u with ENT soon as well for consult.  Nasal turbinated swelling - begin Flonase daily, f/u with ENT soon as planned in early November  Subclinical hypothyroidism - work on better compliance with medicaiton.  Plan to recheck thyroid labs in a month or 2   Elijah [name] was seen today for discuss issues.  Diagnoses and all orders for this visit:  Difficulty with BiPAP use  OSA (obstructive sleep apnea)  Swollen nostril  Snoring  Tonsillar hypertrophy  Subclinical hypothyroidism  Other orders -     fluticasone (FLONASE) 50 MCG/ACT nasal spray; Place 2 sprays into both nostrils daily.   Follow up: pending f/u with home health [facility].

## 2022-02-26 ENCOUNTER — Telehealth: Payer: Self-pay | Admitting: Internal Medicine

## 2022-02-26 DIAGNOSIS — J342 Deviated nasal septum: Secondary | ICD-10-CM | POA: Diagnosis not present

## 2022-02-26 DIAGNOSIS — F4322 Adjustment disorder with anxiety: Secondary | ICD-10-CM | POA: Diagnosis not present

## 2022-02-26 DIAGNOSIS — F900 Attention-deficit hyperactivity disorder, predominantly inattentive type: Secondary | ICD-10-CM | POA: Diagnosis not present

## 2022-02-26 DIAGNOSIS — G4733 Obstructive sleep apnea (adult) (pediatric): Secondary | ICD-10-CM | POA: Diagnosis not present

## 2022-02-26 DIAGNOSIS — K219 Gastro-esophageal reflux disease without esophagitis: Secondary | ICD-10-CM | POA: Diagnosis not present

## 2022-02-26 DIAGNOSIS — J343 Hypertrophy of nasal turbinates: Secondary | ICD-10-CM | POA: Diagnosis not present

## 2022-02-26 NOTE — Telephone Encounter (Signed)
he is on the auto bipap so if he has apnea, it should increase to resolve it.  They could try lowering his min epap, get him in a nasal mask, and encourage him to elevate the head of his bed."   We would have to have an order to lower his min epap presssure and to get a nasal mask. He told us he was having nasal congestion and probably couldn't use a nasal mask. He also told us he has a follow up with his ent dr this month so that also might help too.

## 2022-02-26 NOTE — Telephone Encounter (Signed)
Pt called and spoke with [facility] about bipap. Waiting to hear back.

## 2022-02-27 NOTE — Telephone Encounter (Signed)
Spoke to [facility] with [facility] and he spoke to Respiratory therapist with their office and the RT recommended contacting the MD (clinton young) that read the sleep study originally and ask him  what does he recommend since pt is not tolerating the bipap machine and mask. Matt did say the RT possibly seeing if he can do a ASV study in lab as that would be the next higher machine that they would use from the bipap but she is not really sure if anything would help due to congested and having difficulty breathing. He says the bipap can go down to 5 in setting if you wanted to try that first.

## 2022-02-27 NOTE — Telephone Encounter (Signed)
Called and left him detailed message for pt to call [facility] to get some guidance on setting and mask. I have submitted an order to [facility] to lower setting of bipap to whatever patient can tolerate and to get him a nasal mask to fit face.  Faxed to [phone]

## 2022-02-28 NOTE — Telephone Encounter (Signed)
Will await Dr. Annamaria Boots responds

## 2022-02-28 NOTE — Telephone Encounter (Signed)
Agree with ENT, but suggets you send him to one of our sleep providers for a sleep consult so we can assess directly.

## 2022-03-03 ENCOUNTER — Other Ambulatory Visit: Payer: Self-pay | Admitting: Medical

## 2022-03-03 DIAGNOSIS — R5383 Other fatigue: Secondary | ICD-10-CM

## 2022-03-03 DIAGNOSIS — Z789 Other specified health status: Secondary | ICD-10-CM

## 2022-03-03 DIAGNOSIS — G4733 Obstructive sleep apnea (adult) (pediatric): Secondary | ICD-10-CM

## 2022-03-03 NOTE — Telephone Encounter (Signed)
Left detailed message for pt about referral being put in

## 2022-03-05 ENCOUNTER — Telehealth: Payer: Self-pay | Admitting: Medical

## 2022-03-05 DIAGNOSIS — J343 Hypertrophy of nasal turbinates: Secondary | ICD-10-CM | POA: Diagnosis not present

## 2022-03-05 DIAGNOSIS — J342 Deviated nasal septum: Secondary | ICD-10-CM | POA: Diagnosis not present

## 2022-03-05 DIAGNOSIS — G4733 Obstructive sleep apnea (adult) (pediatric): Secondary | ICD-10-CM | POA: Diagnosis not present

## 2022-03-05 NOTE — Telephone Encounter (Signed)
Pt called about Wegovy rx  He has found pharmacy in [place] that has the same medication but it is not made by [facility]  It is a compound medication and would need to be called in as.  Semaglutide with B12 2.5 mg compound  [facility] [id number] [facility] [place]  [phone]  Please call pt

## 2022-03-06 DIAGNOSIS — F4322 Adjustment disorder with anxiety: Secondary | ICD-10-CM | POA: Diagnosis not present

## 2022-03-06 DIAGNOSIS — F900 Attention-deficit hyperactivity disorder, predominantly inattentive type: Secondary | ICD-10-CM | POA: Diagnosis not present

## 2022-03-06 NOTE — Telephone Encounter (Signed)
Pt was notified of results

## 2022-03-10 DIAGNOSIS — G4733 Obstructive sleep apnea (adult) (pediatric): Secondary | ICD-10-CM | POA: Diagnosis not present

## 2022-03-18 ENCOUNTER — Other Ambulatory Visit: Payer: Self-pay | Admitting: Otolaryngology

## 2022-03-19 DIAGNOSIS — F4322 Adjustment disorder with anxiety: Secondary | ICD-10-CM | POA: Diagnosis not present

## 2022-03-19 DIAGNOSIS — F900 Attention-deficit hyperactivity disorder, predominantly inattentive type: Secondary | ICD-10-CM | POA: Diagnosis not present

## 2022-03-25 ENCOUNTER — Institutional Professional Consult (permissible substitution): Payer: BC Managed Care – PPO | Admitting: Pulmonary Disease

## 2022-03-26 DIAGNOSIS — F4322 Adjustment disorder with anxiety: Secondary | ICD-10-CM | POA: Diagnosis not present

## 2022-03-26 DIAGNOSIS — F900 Attention-deficit hyperactivity disorder, predominantly inattentive type: Secondary | ICD-10-CM | POA: Diagnosis not present

## 2022-04-08 ENCOUNTER — Encounter (HOSPITAL_BASED_OUTPATIENT_CLINIC_OR_DEPARTMENT_OTHER): Payer: Self-pay

## 2022-04-08 ENCOUNTER — Ambulatory Visit (HOSPITAL_BASED_OUTPATIENT_CLINIC_OR_DEPARTMENT_OTHER): Admit: 2022-04-08 | Payer: BC Managed Care – PPO | Admitting: Otolaryngology

## 2022-04-08 SURGERY — SEPTOPLASTY, NOSE, WITH NASAL TURBINATE REDUCTION
Anesthesia: General

## 2022-04-09 ENCOUNTER — Ambulatory Visit: Payer: BC Managed Care – PPO | Admitting: Medical

## 2022-04-09 DIAGNOSIS — G4733 Obstructive sleep apnea (adult) (pediatric): Secondary | ICD-10-CM | POA: Diagnosis not present

## 2022-04-10 ENCOUNTER — Telehealth: Payer: Self-pay | Admitting: Medical

## 2022-04-10 ENCOUNTER — Encounter: Payer: Self-pay | Admitting: Medical

## 2022-04-10 NOTE — Telephone Encounter (Signed)
This patient no showed for their appointment yesterday. Which of the following is necessary for this patient.   A) No follow-up necessary   B) Follow-up urgent. Locate Patient Immediately.   C) Follow-up necessary. Contact patient and Schedule visit in ____ Days.   D) Follow-up Advised. Contact patient and Schedule visit in ____ Days.   E) Please Send no show letter to patient. Charge no show fee if no show was a CPE.

## 2022-04-17 ENCOUNTER — Other Ambulatory Visit: Payer: Self-pay

## 2022-04-17 ENCOUNTER — Encounter (HOSPITAL_BASED_OUTPATIENT_CLINIC_OR_DEPARTMENT_OTHER): Payer: Self-pay | Admitting: Otolaryngology

## 2022-04-28 NOTE — Anesthesia Preprocedure Evaluation (Signed)
Anesthesia Evaluation  Patient identified by MRN, date of birth, ID band Patient awake    Reviewed: Allergy & Precautions, NPO status , Patient's Chart, lab work & pertinent test results  History of Anesthesia Complications Negative for: history of anesthetic complications  Airway Mallampati: II  TM Distance: >3 FB Neck ROM: Full    Dental  (+) Dental Advisory Given   Pulmonary sleep apnea (BiPAP)    breath sounds clear to auscultation       Cardiovascular negative cardio ROS  Rhythm:Regular Rate:Normal     Neuro/Psych    Depression    negative neurological ROS     GI/Hepatic ,GERD  Controlled,,(+)     substance abuse (yesterday)  marijuana use  Endo/Other  Hypothyroidism (does not take replacement)  Morbid obesity  Renal/GU negative Renal ROS     Musculoskeletal   Abdominal  (+) + obese  Peds  Hematology negative hematology ROS (+)   Anesthesia Other Findings   Reproductive/Obstetrics                              Anesthesia Physical Anesthesia Plan  ASA: 3  Anesthesia Plan: General   Post-op Pain Management: Tylenol PO (pre-op)*   Induction: Intravenous  PONV Risk Score and Plan: 2 and Ondansetron and Dexamethasone  Airway Management Planned: Oral ETT  Additional Equipment: None  Intra-op Plan:   Post-operative Plan: Extubation in OR  Informed Consent: I have reviewed the patients History and Physical, chart, labs and discussed the procedure including the risks, benefits and alternatives for the proposed anesthesia with the patient or authorized representative who has indicated his/her understanding and acceptance.     Dental advisory given  Plan Discussed with: CRNA and Surgeon  Anesthesia Plan Comments:          Anesthesia Quick Evaluation

## 2022-04-29 ENCOUNTER — Ambulatory Visit (HOSPITAL_BASED_OUTPATIENT_CLINIC_OR_DEPARTMENT_OTHER): Payer: BC Managed Care – PPO | Admitting: Certified Registered"

## 2022-04-29 ENCOUNTER — Encounter (HOSPITAL_BASED_OUTPATIENT_CLINIC_OR_DEPARTMENT_OTHER)
Admission: RE | Disposition: A | Discharge status: Home / Self Care | Payer: Self-pay | Source: Home / Self Care | Attending: Otolaryngology

## 2022-04-29 ENCOUNTER — Ambulatory Visit (HOSPITAL_BASED_OUTPATIENT_CLINIC_OR_DEPARTMENT_OTHER)
Admission: RE | Admit: 2022-04-29 | Discharge: 2022-04-29 | Disposition: A | Discharge status: Home / Self Care | Payer: BC Managed Care – PPO | Attending: Otolaryngology | Admitting: Otolaryngology

## 2022-04-29 ENCOUNTER — Encounter (HOSPITAL_BASED_OUTPATIENT_CLINIC_OR_DEPARTMENT_OTHER): Payer: Self-pay | Admitting: Otolaryngology

## 2022-04-29 ENCOUNTER — Other Ambulatory Visit: Payer: Self-pay

## 2022-04-29 DIAGNOSIS — J343 Hypertrophy of nasal turbinates: Secondary | ICD-10-CM | POA: Diagnosis not present

## 2022-04-29 DIAGNOSIS — Z6834 Body mass index (BMI) 34.0-34.9, adult: Secondary | ICD-10-CM | POA: Diagnosis not present

## 2022-04-29 DIAGNOSIS — G4730 Sleep apnea, unspecified: Secondary | ICD-10-CM | POA: Diagnosis not present

## 2022-04-29 DIAGNOSIS — G4733 Obstructive sleep apnea (adult) (pediatric): Secondary | ICD-10-CM | POA: Diagnosis present

## 2022-04-29 DIAGNOSIS — E039 Hypothyroidism, unspecified: Secondary | ICD-10-CM | POA: Insufficient documentation

## 2022-04-29 DIAGNOSIS — J342 Deviated nasal septum: Secondary | ICD-10-CM | POA: Diagnosis not present

## 2022-04-29 HISTORY — DX: Hypothyroidism, unspecified: E03.9

## 2022-04-29 HISTORY — PX: NASAL SEPTOPLASTY W/ TURBINOPLASTY: SHX2070

## 2022-04-29 HISTORY — DX: Sleep apnea, unspecified: G47.30

## 2022-04-29 HISTORY — PX: DRUG INDUCED ENDOSCOPY: SHX6808

## 2022-04-29 SURGERY — SEPTOPLASTY, NOSE, WITH NASAL TURBINATE REDUCTION
Anesthesia: General | Site: Nose | Laterality: Bilateral

## 2022-04-29 MED ORDER — EPHEDRINE 5 MG/ML INJ
INTRAVENOUS | Status: AC
  Filled 2022-04-29: qty 5

## 2022-04-29 MED ORDER — ONDANSETRON HCL 4 MG/2ML IJ SOLN: 4.0000 mg | INTRAMUSCULAR | Status: DC | PRN

## 2022-04-29 MED ORDER — ONDANSETRON HCL 4 MG/2ML IJ SOLN
INTRAMUSCULAR | Status: AC
  Filled 2022-04-29: qty 24

## 2022-04-29 MED ORDER — HYDROCODONE-ACETAMINOPHEN 5-325 MG PO TABS: 1.0000 | ORAL_TABLET | ORAL | Status: DC | PRN

## 2022-04-29 MED ORDER — PROPOFOL 10 MG/ML IV BOLUS
INTRAVENOUS | Status: DC | PRN
  Administered 2022-04-29: 150 mg via INTRAVENOUS

## 2022-04-29 MED ORDER — CEFAZOLIN SODIUM-DEXTROSE 2-4 GM/100ML-% IV SOLN
INTRAVENOUS | Status: AC
  Filled 2022-04-29: qty 100

## 2022-04-29 MED ORDER — LACTATED RINGERS IV SOLN: INTRAVENOUS | Status: DC

## 2022-04-29 MED ORDER — MIDAZOLAM HCL 5 MG/5ML IJ SOLN
INTRAMUSCULAR | Status: DC | PRN
  Administered 2022-04-29: 1 mg via INTRAVENOUS

## 2022-04-29 MED ORDER — ONDANSETRON HCL 4 MG PO TABS: 4.0000 mg | ORAL_TABLET | ORAL | Status: DC | PRN

## 2022-04-29 MED ORDER — PROPOFOL 500 MG/50ML IV EMUL
INTRAVENOUS | Status: AC
  Filled 2022-04-29: qty 50

## 2022-04-29 MED ORDER — MIDAZOLAM HCL 2 MG/2ML IJ SOLN
INTRAMUSCULAR | Status: AC
  Filled 2022-04-29: qty 2

## 2022-04-29 MED ORDER — PROPOFOL 500 MG/50ML IV EMUL
INTRAVENOUS | Status: DC | PRN
  Administered 2022-04-29: 45 ug/kg/min via INTRAVENOUS

## 2022-04-29 MED ORDER — LIDOCAINE-EPINEPHRINE 1 %-1:100000 IJ SOLN
INTRAMUSCULAR | Status: DC | PRN
  Administered 2022-04-29: 6 mL

## 2022-04-29 MED ORDER — DEXAMETHASONE SODIUM PHOSPHATE 4 MG/ML IJ SOLN
INTRAMUSCULAR | Status: DC | PRN
  Administered 2022-04-29: 5 mg via INTRAVENOUS

## 2022-04-29 MED ORDER — OXYMETAZOLINE HCL 0.05 % NA SOLN: 2.0000 | Freq: Two times a day (BID) | NASAL | Status: DC | PRN

## 2022-04-29 MED ORDER — PROMETHAZINE HCL 25 MG/ML IJ SOLN: 6.2500 mg | INTRAMUSCULAR | Status: DC | PRN

## 2022-04-29 MED ORDER — ACETAMINOPHEN 500 MG PO TABS
1000.0000 mg | ORAL_TABLET | Freq: Once | ORAL | Status: AC
  Administered 2022-04-29: 1000 mg via ORAL

## 2022-04-29 MED ORDER — OXYCODONE HCL 5 MG PO TABS
5.0000 mg | ORAL_TABLET | Freq: Once | ORAL | Status: AC
  Administered 2022-04-29: 5 mg via ORAL
  Filled 2022-04-29: qty 1

## 2022-04-29 MED ORDER — FENTANYL CITRATE (PF) 100 MCG/2ML IJ SOLN
INTRAMUSCULAR | Status: AC
  Filled 2022-04-29: qty 2

## 2022-04-29 MED ORDER — CEPHALEXIN 500 MG PO CAPS: 500.0000 mg | ORAL_CAPSULE | Freq: Three times a day (TID) | ORAL | 0 refills | Status: AC

## 2022-04-29 MED ORDER — KCL IN DEXTROSE-NACL 20-5-0.45 MEQ/L-%-% IV SOLN
INTRAVENOUS | Status: DC
  Filled 2022-04-29: qty 1000

## 2022-04-29 MED ORDER — MEPERIDINE HCL 25 MG/ML IJ SOLN: 6.2500 mg | INTRAMUSCULAR | Status: DC | PRN

## 2022-04-29 MED ORDER — MORPHINE SULFATE (PF) 4 MG/ML IV SOLN
INTRAVENOUS | Status: AC
  Filled 2022-04-29: qty 1

## 2022-04-29 MED ORDER — ONDANSETRON HCL 4 MG/2ML IJ SOLN
INTRAMUSCULAR | Status: DC | PRN
  Administered 2022-04-29: 4 mg via INTRAVENOUS

## 2022-04-29 MED ORDER — ACETAMINOPHEN 500 MG PO TABS
ORAL_TABLET | ORAL | Status: AC
  Filled 2022-04-29: qty 2

## 2022-04-29 MED ORDER — PHENYLEPHRINE 80 MCG/ML (10ML) SYRINGE FOR IV PUSH (FOR BLOOD PRESSURE SUPPORT)
PREFILLED_SYRINGE | INTRAVENOUS | Status: AC
  Filled 2022-04-29: qty 10

## 2022-04-29 MED ORDER — CEFAZOLIN SODIUM-DEXTROSE 2-4 GM/100ML-% IV SOLN
2.0000 g | INTRAVENOUS | Status: AC
  Administered 2022-04-29: 2 g via INTRAVENOUS

## 2022-04-29 MED ORDER — OXYCODONE HCL 5 MG PO TABS: 5.0000 mg | ORAL_TABLET | Freq: Once | ORAL | Status: DC | PRN

## 2022-04-29 MED ORDER — OXYCODONE HCL 5 MG/5ML PO SOLN: 5.0000 mg | Freq: Once | ORAL | Status: DC | PRN

## 2022-04-29 MED ORDER — OXYMETAZOLINE HCL 0.05 % NA SOLN
NASAL | Status: AC
  Filled 2022-04-29: qty 120

## 2022-04-29 MED ORDER — SALINE SPRAY 0.65 % NA SOLN: 2.0000 | NASAL | Status: DC

## 2022-04-29 MED ORDER — MUPIROCIN 2 % EX OINT
TOPICAL_OINTMENT | CUTANEOUS | Status: DC | PRN
  Administered 2022-04-29: 1 via NASAL

## 2022-04-29 MED ORDER — FENTANYL CITRATE (PF) 100 MCG/2ML IJ SOLN
INTRAMUSCULAR | Status: DC | PRN
  Administered 2022-04-29: 30 ug via INTRAVENOUS
  Administered 2022-04-29: 100 ug via INTRAVENOUS

## 2022-04-29 MED ORDER — HYDROCODONE-ACETAMINOPHEN 5-325 MG PO TABS: 1.0000 | ORAL_TABLET | Freq: Four times a day (QID) | ORAL | 0 refills | Status: DC | PRN

## 2022-04-29 MED ORDER — SUGAMMADEX SODIUM 500 MG/5ML IV SOLN
INTRAVENOUS | Status: DC | PRN
  Administered 2022-04-29: 500 mg via INTRAVENOUS

## 2022-04-29 MED ORDER — OXYMETAZOLINE HCL 0.05 % NA SOLN
NASAL | Status: DC | PRN
  Administered 2022-04-29 (×2): 1 via TOPICAL

## 2022-04-29 MED ORDER — MORPHINE SULFATE (PF) 4 MG/ML IV SOLN
2.0000 mg | INTRAVENOUS | Status: DC | PRN
  Administered 2022-04-29: 4 mg via INTRAVENOUS

## 2022-04-29 MED ORDER — DEXAMETHASONE SODIUM PHOSPHATE 10 MG/ML IJ SOLN
INTRAMUSCULAR | Status: AC
  Filled 2022-04-29: qty 2

## 2022-04-29 MED ORDER — LIDOCAINE 2% (20 MG/ML) 5 ML SYRINGE
INTRAMUSCULAR | Status: AC
  Filled 2022-04-29: qty 30

## 2022-04-29 MED ORDER — HYDROMORPHONE HCL 1 MG/ML IJ SOLN: 0.2500 mg | INTRAMUSCULAR | Status: DC | PRN

## 2022-04-29 MED ORDER — SUGAMMADEX SODIUM 500 MG/5ML IV SOLN
INTRAVENOUS | Status: AC
  Filled 2022-04-29: qty 5

## 2022-04-29 MED ORDER — ROCURONIUM BROMIDE 100 MG/10ML IV SOLN
INTRAVENOUS | Status: DC | PRN
  Administered 2022-04-29: 60 mg via INTRAVENOUS

## 2022-04-29 MED ORDER — MIDAZOLAM HCL 2 MG/2ML IJ SOLN: 0.5000 mg | Freq: Once | INTRAMUSCULAR | Status: DC | PRN

## 2022-04-29 SURGICAL SUPPLY — 39 items
ATTRACTOMAT 16X20 MAGNETIC DRP (DRAPES) IMPLANT
BLADE INF TURB ROT M4 2 5PK (BLADE) IMPLANT
BLADE TRICUT ROTATE M4 4 5PK (BLADE) IMPLANT
CANISTER SUCT 1200ML W/VALVE (MISCELLANEOUS) ×1 IMPLANT
COAGULATOR SUCT 8FR VV (MISCELLANEOUS) IMPLANT
DRSG NASOPORE 8CM (GAUZE/BANDAGES/DRESSINGS) IMPLANT
DRSG TELFA 3X8 NADH STRL (GAUZE/BANDAGES/DRESSINGS) IMPLANT
ELECT REM PT RETURN 9FT ADLT (ELECTROSURGICAL) ×1
ELECTRODE REM PT RTRN 9FT ADLT (ELECTROSURGICAL) ×1 IMPLANT
GLOVE BIO SURGEON STRL SZ7.5 (GLOVE) ×1 IMPLANT
GLOVE BIOGEL PI IND STRL 7.0 (GLOVE) IMPLANT
GOWN STRL REUS W/ TWL LRG LVL3 (GOWN DISPOSABLE) ×2 IMPLANT
GOWN STRL REUS W/TWL LRG LVL3 (GOWN DISPOSABLE) ×3
HEMOSTAT SURGICEL .5X2 ABSORB (HEMOSTASIS) IMPLANT
IV NS 500ML (IV SOLUTION) ×1
IV NS 500ML BAXH (IV SOLUTION) ×1 IMPLANT
KIT CLEAN ENDO (MISCELLANEOUS) ×1 IMPLANT
NDL HYPO 27GX1-1/4 (NEEDLE) ×1 IMPLANT
NEEDLE HYPO 27GX1-1/4 (NEEDLE) ×1 IMPLANT
NS IRRIG 1000ML POUR BTL (IV SOLUTION) IMPLANT
PACK BASIN DAY SURGERY FS (CUSTOM PROCEDURE TRAY) ×1 IMPLANT
PACK ENT DAY SURGERY (CUSTOM PROCEDURE TRAY) ×1 IMPLANT
PATTIES SURGICAL .5 X3 (DISPOSABLE) ×1 IMPLANT
SHEET MEDIUM DRAPE 40X70 STRL (DRAPES) ×1 IMPLANT
SHEET SILICONE 2X3 0.03 REINF (MISCELLANEOUS) IMPLANT
SLEEVE SCD COMPRESS KNEE MED (STOCKING) ×1 IMPLANT
SOL ANTI FOG 6CC (MISCELLANEOUS) ×1 IMPLANT
SPIKE FLUID TRANSFER (MISCELLANEOUS) IMPLANT
SPLINT NASAL AIRWAY SILICONE (MISCELLANEOUS) ×1 IMPLANT
SPONGE GAUZE 2X2 8PLY STRL LF (GAUZE/BANDAGES/DRESSINGS) ×1 IMPLANT
SUT CHROMIC 2 0 SH (SUTURE) ×1 IMPLANT
SUT CHROMIC 4 0 P 3 18 (SUTURE) ×1 IMPLANT
SUT ETHILON 6 0 P 1 (SUTURE) IMPLANT
SUT PLAIN 4 0 ~~LOC~~ 1 (SUTURE) IMPLANT
SYR CONTROL 10ML LL (SYRINGE) IMPLANT
TOWEL GREEN STERILE FF (TOWEL DISPOSABLE) ×1 IMPLANT
TRAY DSU PREP LF (CUSTOM PROCEDURE TRAY) ×1 IMPLANT
TUBE CONNECTING 20X1/4 (TUBING) ×1 IMPLANT
YANKAUER SUCT BULB TIP NO VENT (SUCTIONS) ×1 IMPLANT

## 2022-04-29 NOTE — Anesthesia Postprocedure Evaluation (Signed)
Anesthesia Post Note  Patient: Elijah [name]  Procedure(s) Performed: NASAL SEPTOPLASTY WITH TURBINATE REDUCTION (Bilateral: Nose) DRUG INDUCED ENDOSCOPY (Bilateral: Nose)     Patient location during evaluation: PACU Anesthesia Type: General Level of consciousness: awake and alert, patient cooperative and oriented Pain management: pain level controlled Vital Signs Assessment: post-procedure vital signs reviewed and stable Respiratory status: spontaneous breathing, nonlabored ventilation and respiratory function stable Cardiovascular status: blood pressure returned to baseline and stable Postop Assessment: no apparent nausea or vomiting Anesthetic complications: no   No notable events documented.  Last Vitals:  Vitals:   04/29/22 0930 04/29/22 0945  BP: 139/89 (!) 125/91  Pulse: 95 (!) 103  Resp: 17 (!) 21  Temp:  36.7 C  SpO2: 97% 96%    Last Pain:  Vitals:   04/29/22 0945  TempSrc: Oral  PainSc: 2                  Jasmain Ahlberg,E. Corrinne Benegas

## 2022-04-29 NOTE — Op Note (Signed)
PREOPERATIVE DIAGNOSIS:  Obstructive sleep apnea, septal deviation, and bilateral turbinate hypertrophy   POSTOPERATIVE DIAGNOSIS:  Obstructive sleep apnea, septal deviation, and bilateral turbinate hypertrophy   PROCEDURE:  Drug-induced sleep endoscopy, septoplasty, bilateral turbinate reduction   SURGEON:  Melida Quitter, MD   ANESTHESIA:  IV sedation, then general endotracheal anesthesia   COMPLICATIONS:  None   INDICATIONS:  The patient is a 27 year old male with obstructive sleep apnea being treated with BiPAP who has been troubled with nasal obstruction not responding to medications.  He presents to the operating room for surgical management.   FINDINGS:  Leftward septal deviation.  Enlarged turbinates.  There is 50% anterior-posterior collapse at the velum making him a potential candidate for hypoglossal nerve stimulator placement.  There was also anterior-posterior collapse at the tongue base.   DESCRIPTION OF PROCEDURE:  The patient was identified in the holding room, informed consent having been obtained including discussion of risks, benefits and alternatives, the patient was brought to the operative suite and put the operative table in the supine position.  Anesthesia was induced and the patient was given light sedation to simulate natural sleep. When the proper level was reached, an Afrin-soaked pledget was placed in the right nasal passage for a couple of minutes and then removed.  The fiberoptic laryngoscope was then passed to view the pharynx and larynx.  Findings are noted above and the exam was recorded.  After completion, the scope was removed.  General anesthesia was then induced and the patient was intubated by the anesthesia team without difficulty.  The eyes were taped closed.  The patient was given intravenous antibiotics during the case.  The face was prepped and draped in sterile fashion.  Afrin-soaked pledgets were placed in both sides of the nose.   The septum was then  injected with local anesthetic on both sides.  A left-sided Killian incision was made and the mucoperichondral flap was elevated down the left side.  The bony-cartilaginous junction was divided and posterior bone was partially removed.  The inferior cartilage was then removed as well.  An osteotome was used to remove the maxillary spine.  A posterior portion of the quadrangular cartilage was removed.  The septum laid in the midline.  Soft tissues were redraped with a small tear in the right-sided flap.  The inferior turbinates were injected with local anesthetic.  An incision was made at the anterior head of each inferior turbinate and soft tissues were elevated off of the underlying bone using a freer elevator.  The microdebrider was then used with the turbinate blade to remove submucosal tissue keeping the overlying mucosa and underlying bone intact.  Soft tissues were then redraped and the turbinates out-fractured.  The nasal passage was suctioned and Afrin-soaked pledgets were placed for a few minutes.    Pledgets were then removed.  Doyle stents coated in mupirocin ointment were then placed in both nasal passages.  The Killian incision was closed with 4-0 chromic in a simple, interrupted fashion.  Stents were placed in position and secured with a through-and-through vertical mattress suture using 2-0 chromic.  Nasal passages and the throat were suctioned.   Drapes were removed and the patient was cleaned off.  The patient was returned to anesthesia for wakeup, extubated, and taken to the recovery room in stable condition.

## 2022-04-29 NOTE — Transfer of Care (Signed)
Immediate Anesthesia Transfer of Care Note  Patient: [facility]  Procedure(s) Performed: NASAL SEPTOPLASTY WITH TURBINATE REDUCTION (Bilateral: Nose) DRUG INDUCED ENDOSCOPY (Bilateral: Nose)  Patient Location: PACU  Anesthesia Type:General  Level of Consciousness: awake, alert , oriented, and patient cooperative  Airway & Oxygen Therapy: Patient Spontanous Breathing and Patient connected to face mask oxygen  Post-op Assessment: Report given to RN and Post -op Vital signs reviewed and stable  Post vital signs: Reviewed and stable  Last Vitals:  Vitals Value Taken Time  BP    Temp    Pulse 102 04/29/22 0909  Resp    SpO2 100 % 04/29/22 0909  Vitals shown include unvalidated device data.  Last Pain:  Vitals:   04/29/22 0722  TempSrc: Oral  PainSc: 0-No pain         Complications: No notable events documented.

## 2022-04-29 NOTE — Discharge Instructions (Signed)
  Post Anesthesia Home Care Instructions  Activity: Get plenty of rest for the remainder of the day. A responsible individual must stay with you for 24 hours following the procedure.  For the next 24 hours, DO NOT: -Drive a car -Paediatric nurse -Drink alcoholic beverages -Take any medication unless instructed by your physician -Make any legal decisions or sign important papers.  Meals: Start with liquid foods such as gelatin or soup. Progress to regular foods as tolerated. Avoid greasy, spicy, heavy foods. If nausea and/or vomiting occur, drink only clear liquids until the nausea and/or vomiting subsides. Call your physician if vomiting continues.  Special Instructions/Symptoms: Your throat may feel dry or sore from the anesthesia or the breathing tube placed in your throat during surgery. If this causes discomfort, gargle with warm salt water. The discomfort should disappear within 24 hours.  You may have Tylenol after 1:30 pm if needed.

## 2022-04-29 NOTE — H&P (Signed)
Elijah [name] is an 27 y.o. male.   Chief Complaint: Nasal obstruction and sleep apnea HPI: 27 year old male with obstructive sleep apnea being managed by BiPAP who has problems with nasal obstruction not responsive to medical therapy.  He presents for surgical management.  Past Medical History:  Diagnosis Date   Acne    Depression    Gastroesophageal reflux disease without esophagitis 03/19/2021   Hypothyroidism    Scoliosis    Sleep apnea     Past Surgical History:  Procedure Laterality Date   TYMPANOSTOMY TUBE PLACEMENT      Family History  Problem Relation Age of Onset   Diabetes Father    Colon cancer Neg Hx    Esophageal cancer Neg Hx    Stomach cancer Neg Hx    Pancreatic cancer Neg Hx    Social History:  reports that he has never smoked. He has never used smokeless tobacco. He reports current drug use. Drug: Marijuana. He reports that he does not drink alcohol.  Allergies: No Known Allergies  Medications Prior to Admission  Medication Sig Dispense Refill   fluticasone (FLONASE) 50 MCG/ACT nasal spray Place 2 sprays into both nostrils daily. 16 g 1   levothyroxine (SYNTHROID) 25 MCG tablet Take 1 tablet (25 mcg total) by mouth daily. 30 tablet 3    No results found for this or any previous visit (from the past 48 hour(s)). No results found.  Review of Systems  All other systems reviewed and are negative.   Blood pressure 107/77, pulse 71, temperature 97.8 F (36.6 C), temperature source Oral, resp. rate 14, height 5\' 10"  (1.778 m), weight 108.8 kg, SpO2 97 %. Physical Exam Constitutional:      Appearance: Normal appearance. He is normal weight.  HENT:     Head: Normocephalic and atraumatic.     Right Ear: External ear normal.     Left Ear: External ear normal.     Nose: Nose normal.     Mouth/Throat:     Mouth: Mucous membranes are moist.     Pharynx: Oropharynx is clear.  Eyes:     Extraocular Movements: Extraocular movements intact.      Conjunctiva/sclera: Conjunctivae normal.     Pupils: Pupils are equal, round, and reactive to light.  Cardiovascular:     Rate and Rhythm: Normal rate.  Pulmonary:     Effort: Pulmonary effort is normal.  Musculoskeletal:     Cervical back: Normal range of motion.  Skin:    General: Skin is warm and dry.  Neurological:     General: No focal deficit present.     Mental Status: He is alert and oriented to person, place, and time. Mental status is at baseline.  Psychiatric:        Mood and Affect: Mood normal.        Behavior: Behavior normal.        Thought Content: Thought content normal.        Judgment: Judgment normal.      Assessment/Plan Nasal septal deviation, turbinate hypertrophy, and obstructive sleep apnea  To OR for septoplasty, bilateral turbinate reduction, and sleep endoscopy.  Melida Quitter, MD 04/29/2022, 8:00 AM

## 2022-04-29 NOTE — Anesthesia Procedure Notes (Signed)
Procedure Name: Intubation Date/Time: 04/29/2022 8:27 AM  Performed by: Jasey Cortez, Ernesta Amble, CRNAPre-anesthesia Checklist: Patient identified, Emergency Drugs available, Suction available and Patient being monitored Patient Re-evaluated:Patient Re-evaluated prior to induction Oxygen Delivery Method: Circle system utilized Preoxygenation: Pre-oxygenation with 100% oxygen Induction Type: IV induction Ventilation: Mask ventilation without difficulty Laryngoscope Size: Mac and 3 Grade View: Grade I Tube type: Oral Tube size: 7.0 mm Number of attempts: 1 Airway Equipment and Method: Stylet and Oral airway Placement Confirmation: ETT inserted through vocal cords under direct vision, positive ETCO2 and breath sounds checked- equal and bilateral Secured at: 22 cm Tube secured with: Tape Dental Injury: Teeth and Oropharynx as per pre-operative assessment

## 2022-04-29 NOTE — Brief Op Note (Signed)
04/29/2022  9:00 AM  PATIENT:  Elijah [name]  27 y.o. male  PRE-OPERATIVE DIAGNOSIS:  Obstructive sleep apnea, Nasal turbinate hypertrophy, Deviated septum  POST-OPERATIVE DIAGNOSIS:  Obstructive sleep apnea, Nasal turbinate hypertrophy, Deviated septum  PROCEDURE:  Procedure(s): NASAL SEPTOPLASTY WITH TURBINATE REDUCTION (Bilateral) DRUG INDUCED ENDOSCOPY (Bilateral)  SURGEON:  Surgeon(s) and Role:    Melida Quitter, MD - Primary  PHYSICIAN ASSISTANT:   ASSISTANTS: none   ANESTHESIA:   general  EBL:  25 mL   BLOOD ADMINISTERED:none  DRAINS: none   LOCAL MEDICATIONS USED:  LIDOCAINE   SPECIMEN:  No Specimen  DISPOSITION OF SPECIMEN:  N/A  COUNTS:  YES  TOURNIQUET:  * No tourniquets in log *  DICTATION: .Note written in EPIC  PLAN OF CARE:  Extended observation  PATIENT DISPOSITION:  PACU - hemodynamically stable.   Delay start of Pharmacological VTE agent (>24hrs) due to surgical blood loss or risk of bleeding: yes

## 2022-04-30 ENCOUNTER — Encounter (HOSPITAL_BASED_OUTPATIENT_CLINIC_OR_DEPARTMENT_OTHER): Payer: Self-pay | Admitting: Otolaryngology

## 2022-06-02 DIAGNOSIS — F4322 Adjustment disorder with anxiety: Secondary | ICD-10-CM | POA: Diagnosis not present

## 2022-06-05 DIAGNOSIS — F4322 Adjustment disorder with anxiety: Secondary | ICD-10-CM | POA: Diagnosis not present

## 2022-06-12 DIAGNOSIS — F4322 Adjustment disorder with anxiety: Secondary | ICD-10-CM | POA: Diagnosis not present

## 2022-06-19 DIAGNOSIS — F4322 Adjustment disorder with anxiety: Secondary | ICD-10-CM | POA: Diagnosis not present

## 2022-06-25 ENCOUNTER — Encounter: Payer: Self-pay | Admitting: Medical

## 2022-06-25 ENCOUNTER — Ambulatory Visit: Payer: BC Managed Care – PPO | Admitting: Medical

## 2022-06-25 VITALS — BP 120/88 | HR 72 | Ht 70.0 in | Wt 234.6 lb

## 2022-06-25 DIAGNOSIS — F4322 Adjustment disorder with anxiety: Secondary | ICD-10-CM | POA: Diagnosis not present

## 2022-06-25 DIAGNOSIS — F900 Attention-deficit hyperactivity disorder, predominantly inattentive type: Secondary | ICD-10-CM | POA: Diagnosis not present

## 2022-06-25 DIAGNOSIS — R200 Anesthesia of skin: Secondary | ICD-10-CM

## 2022-06-25 DIAGNOSIS — E038 Other specified hypothyroidism: Secondary | ICD-10-CM | POA: Diagnosis not present

## 2022-06-25 MED ORDER — PREDNISONE 10 MG PO TABS: ORAL_TABLET | ORAL | 0 refills | Status: DC

## 2022-06-25 MED ORDER — LEVOTHYROXINE SODIUM 25 MCG PO TABS: 25.0000 ug | ORAL_TABLET | Freq: Every day | ORAL | 3 refills | Status: DC

## 2022-06-25 NOTE — Progress Notes (Signed)
Subjective:  Elijah [name] is a 27 y.o. male who presents for Chief Complaint  Patient presents with   Numbness    Left hand and arm numbness. Left arm numbness he experienced about a year ago on a hiking trip. Last weekend long car drive and after an hour left hand fell asleep and part of hand is still numb. Elbow feels like he is "hitting his funnybone."      Here for c/o left hand numbness.  Last week after a long car drive, left hand fell asleep.  Hand is still numb since.  Mostly left 4th and 5th finger, but sometimes whole hand numb.  Gets occasional neck stiffness, but no consistent neck pain.  Last year on a hike the whole left arm got numbness and weak on a backpacking trip.  Took weeks to resolve.  Nonsmoker.   No alcohol  Has hx/o subclinical hypothyroidism but he often forgets to take his medication.  No other aggravating or relieving factors.    No other c/o.  The following portions of the patient's history were reviewed and updated as appropriate: allergies, current medications, past family history, past medical history, past social history, past surgical history and problem list.  ROS Otherwise as in subjective above    Objective: BP 120/88   Pulse 72   Ht '5\' 10"'$  (1.778 m)   Wt 234 lb 9.6 oz (106.4 kg)   SpO2 98%   BMI 33.66 kg/m   General appearance: alert, no distress, well developed, well nourished neck: supple, no lymphadenopathy, no thyromegaly, no masses, normal ROM, nontender Left arm nontender, normal ROM, no swelling or deformity Pulses: 2+ radial pulses, 2+ pedal pulses, normal cap refill Ext: no edema   Assessment: Encounter Diagnoses  Name Primary?   Arm numbness left Yes   Subclinical hypothyroidism      Plan: Arm numbness, inflammation of ulnar nerve - advised arm rest, possible arm sling short term, begin prednisone below. Avoid propping in the arm particular at bedtime or in the car.  If not much improved in the next 2 weeks, let me know  and I'll refer to orthopedics  Subclinical hypothyroidism - restart medication  Return soon for a well visit  Elijah [name] was seen today for numbness.  Diagnoses and all orders for this visit:  Arm numbness left  Subclinical hypothyroidism  Other orders -     predniSONE (DELTASONE) 10 MG tablet; 6 tablets all together day 1, 5 tablets day 2, 4 tablets day 3, 3 tablets day 4, 2 tablets day 5, 1 tablet day 6. -     levothyroxine (SYNTHROID) 25 MCG tablet; Take 1 tablet (25 mcg total) by mouth daily.   Follow up: soon for a well visit

## 2022-07-03 DIAGNOSIS — F900 Attention-deficit hyperactivity disorder, predominantly inattentive type: Secondary | ICD-10-CM | POA: Diagnosis not present

## 2022-07-03 DIAGNOSIS — F4322 Adjustment disorder with anxiety: Secondary | ICD-10-CM | POA: Diagnosis not present

## 2022-07-09 DIAGNOSIS — F4322 Adjustment disorder with anxiety: Secondary | ICD-10-CM | POA: Diagnosis not present

## 2022-07-15 ENCOUNTER — Other Ambulatory Visit: Payer: Self-pay

## 2022-07-15 ENCOUNTER — Encounter: Payer: Self-pay | Admitting: Physician Assistant

## 2022-07-15 ENCOUNTER — Ambulatory Visit (INDEPENDENT_AMBULATORY_CARE_PROVIDER_SITE_OTHER): Payer: BC Managed Care – PPO | Admitting: Physician Assistant

## 2022-07-15 DIAGNOSIS — S83512A Sprain of anterior cruciate ligament of left knee, initial encounter: Secondary | ICD-10-CM | POA: Diagnosis not present

## 2022-07-15 DIAGNOSIS — M25562 Pain in left knee: Secondary | ICD-10-CM

## 2022-07-15 DIAGNOSIS — S83242D Other tear of medial meniscus, current injury, left knee, subsequent encounter: Secondary | ICD-10-CM | POA: Insufficient documentation

## 2022-07-15 MED ORDER — LIDOCAINE HCL 1 % IJ SOLN
4.0000 mL | INTRAMUSCULAR | Status: AC | PRN
  Administered 2022-07-15: 4 mL

## 2022-07-15 NOTE — Progress Notes (Signed)
Office Visit Note   Patient: Elijah [name]           Date of Birth: 22-Nov-1995           MRN: [id number] Visit Date: 07/15/2022              Requested by: Carlena Hurl, PA-C [address] [place],  [place] [address] PCP: Carlena Hurl, PA-C   Assessment & Plan: Visit Diagnoses:  1. Left knee pain, unspecified chronicity   2. Other tear of medial meniscus, current injury, left knee, subsequent encounter   3. Rupture of anterior cruciate ligament of left knee, initial encounter     Plan: Elijah [name] is a 27 year old gentleman who is 3 days status post injury to his left knee.  He was with friends at and sustained a fall.  He says he felt a pop and then his knee got stiff.  He has difficulty and cannot bend it and has pain with weightbearing.  Also complaining of instability.  Had a meniscus tear in his right knee as a young child which was treated obviously conservatively.  Exam today shows he has a significant effusion.  40 cc of frank blood was aspirated from his knee.  Did not have any fat droplets that I could appreciate.  He does have some tenderness over the joint line.  Difficult to evaluate for anterior draw as he could not bend his knee and was very resistant.  Given the hemarthrosis I am concerned for meniscal and or ACL injury.  Will order an MRI ASAP.  I did speak with Dr. Sammuel Hines who will see him next week.  In the meantime recommended icing elevating doing ankle pumps.  He did ask for crutches and a knee brace which was provided to him.  Follow-Up Instructions: Return With Dr. Sammuel Hines after MRI.   Orders:  Orders Placed This Encounter  Procedures   XR KNEE 3 VIEW LEFT   MR Knee Left w/o contrast   No orders of the defined types were placed in this encounter.     Procedures: Large Joint Inj: L knee on 07/15/2022 4:30 PM Indications: pain and diagnostic evaluation Details: 25 G 1.5 in needle, superolateral approach  Arthrogram: No  Medications: 4 mL lidocaine 1  % Aspirate: 40 mL bloody Outcome: tolerated well, no immediate complications  After obtaining verbal consent the superior lateral pouch was prepped with alcohol and Betadine x 2.  4 cc of lidocaine plain was injected on a 25-gauge needle.  After adequate anesthetic was achieved 18-gauge needle was inserted.  40 cc of frank blood was aspirated without difficulty.  Band-Aid and compressive Ace applied did check for fat droplets in the aspirate could not appreciate any Procedure, treatment alternatives, risks and benefits explained, specific risks discussed. Consent was given by the patient.     Clinical Data: No additional findings.   Subjective: Chief Complaint  Patient presents with   Left Knee - Pain, Edema    HPI Husam is a 27 year old gentleman presents today with left knee swelling pain and decreased range of motion after a fall over the weekend.  He did hear a loud pop and then got immediate stiffness in his knee.  Has difficulty bearing weight because of pain and stiffness  Review of Systems  All other systems reviewed and are negative.    Objective: Vital Signs: There were no vitals taken for this visit.  Physical Exam Constitutional:      Appearance: Normal appearance.  Pulmonary:     Effort: Pulmonary effort is normal.  Skin:    General: Skin is warm and dry.  Neurological:     General: No focal deficit present.     Mental Status: He is alert.  Psychiatric:        Mood and Affect: Mood normal.    Ortho Exam Left knee.  He is neurovascular intact compartments are soft and nontender he has good dorsiflexion and plantarflexion.  He is able to sustain good strength with a straight leg raise nontender over the quad tendon nontender over the patellar tendon.  Some tenderness over the medial joint line.  He is very hesitant to anterior drawer with guarding.  He cannot bend his knee past 60 degrees.  He has a palpable moderate effusion Specialty Comments:  No specialty  comments available.  Imaging: No results found.   PMFS History: Patient Active Problem List   Diagnosis Date Noted   Other tear of medial meniscus, current injury, left knee, subsequent encounter 07/15/2022   Left ACL tear 07/15/2022   Obstructive sleep apnea 04/29/2022   Difficulty with BiPAP use 02/19/2022   Swollen nostril 02/19/2022   OSA (obstructive sleep apnea) 01/20/2022   Other fatigue 12/23/2021   Non-restorative sleep 12/23/2021   Subclinical hypothyroidism 08/15/2021   Abnormal TSH 08/13/2021   GERD without esophagitis 08/13/2021   Tonsillar hypertrophy 03/19/2021   Eustachian tube dysfunction, bilateral 08/18/2017   Snoring 08/18/2017   Past Medical History:  Diagnosis Date   Acne    Depression    Gastroesophageal reflux disease without esophagitis 03/19/2021   Hypothyroidism    Scoliosis    Sleep apnea     Family History  Problem Relation Age of Onset   Diabetes Father    Colon cancer Neg Hx    Esophageal cancer Neg Hx    Stomach cancer Neg Hx    Pancreatic cancer Neg Hx     Past Surgical History:  Procedure Laterality Date   DRUG INDUCED ENDOSCOPY Bilateral 04/29/2022   Procedure: DRUG INDUCED ENDOSCOPY;  Surgeon: Melida Quitter, MD;  Location: [facility];  Service: ENT;  Laterality: Bilateral;   NASAL SEPTOPLASTY W/ TURBINOPLASTY Bilateral 04/29/2022   Procedure: NASAL SEPTOPLASTY WITH TURBINATE REDUCTION;  Surgeon: Melida Quitter, MD;  Location: [facility];  Service: ENT;  Laterality: Bilateral;   TYMPANOSTOMY TUBE PLACEMENT     Social History   Occupational History   Not on file  Tobacco Use   Smoking status: Never   Smokeless tobacco: Never  Vaping Use   Vaping Use: Never used  Substance and Sexual Activity   Alcohol use: Never   Drug use: Yes    Types: Marijuana   Sexual activity: Not on file

## 2022-07-20 ENCOUNTER — Ambulatory Visit
Admission: RE | Admit: 2022-07-20 | Discharge: 2022-07-20 | Disposition: A | Discharge status: Home / Self Care | Payer: BC Managed Care – PPO | Source: Ambulatory Visit | Attending: Physician Assistant | Admitting: Physician Assistant

## 2022-07-20 DIAGNOSIS — M25562 Pain in left knee: Secondary | ICD-10-CM | POA: Diagnosis not present

## 2022-07-21 ENCOUNTER — Telehealth: Payer: Self-pay | Admitting: Orthopaedic Surgery

## 2022-07-21 NOTE — Telephone Encounter (Signed)
Informed Elijah [name], Dr. Sammuel Hines does not review MRI's over phone when he hasn't met them yet. Offered and scheduled appt 3/27 at 10am

## 2022-07-21 NOTE — Telephone Encounter (Signed)
Pt's mother Eustaquio Maize called requesting MRI results be read over the phone. Pt was seen by PA Persons and want pt to follow up with Dr Sammuel Hines. Mom is worried and really don't want to wait until April 12 which is next available appt for Dr Sammuel Hines. Please call Pt and mom at [phone]. Mri was done on 07/20/22.

## 2022-07-23 ENCOUNTER — Ambulatory Visit (INDEPENDENT_AMBULATORY_CARE_PROVIDER_SITE_OTHER): Payer: BC Managed Care – PPO | Admitting: Orthopaedic Surgery

## 2022-07-23 DIAGNOSIS — S83005A Unspecified dislocation of left patella, initial encounter: Secondary | ICD-10-CM
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
## 2022-07-25 DIAGNOSIS — F4322 Adjustment disorder with anxiety: Secondary | ICD-10-CM | POA: Diagnosis not present

## 2022-07-31 ENCOUNTER — Ambulatory Visit: Payer: BC Managed Care – PPO | Admitting: Physician Assistant

## 2022-07-31 DIAGNOSIS — F4322 Adjustment disorder with anxiety: Secondary | ICD-10-CM | POA: Diagnosis not present

## 2022-08-06 DIAGNOSIS — F4322 Adjustment disorder with anxiety: Secondary | ICD-10-CM | POA: Diagnosis not present

## 2022-08-14 DIAGNOSIS — F4322 Adjustment disorder with anxiety: Secondary | ICD-10-CM | POA: Diagnosis not present

## 2022-08-20 DIAGNOSIS — F4322 Adjustment disorder with anxiety: Secondary | ICD-10-CM | POA: Diagnosis not present

## 2022-08-27 DIAGNOSIS — F4322 Adjustment disorder with anxiety: Secondary | ICD-10-CM | POA: Diagnosis not present

## 2022-09-03 ENCOUNTER — Ambulatory Visit (HOSPITAL_BASED_OUTPATIENT_CLINIC_OR_DEPARTMENT_OTHER): Payer: BC Managed Care – PPO | Admitting: Orthopaedic Surgery

## 2022-09-03 DIAGNOSIS — F4322 Adjustment disorder with anxiety: Secondary | ICD-10-CM | POA: Diagnosis not present

## 2022-09-08 ENCOUNTER — Telehealth: Payer: Self-pay

## 2022-09-08 NOTE — Telephone Encounter (Signed)
Pt called and advised he will need to re-do the 90 day (trial?) period for his sleep study. He was trying to order supplies but they would not allow it as he missed days due to his surgery for deviated septum. He is requesting documentation to be sent. I advised he would need to get the records from the place he had surgery. I'm not sure if we could send a letter to address this as well.

## 2022-09-09 NOTE — Telephone Encounter (Signed)
Left message for pt to call back. Trying to find out what he is actually needing?

## 2022-09-09 NOTE — Telephone Encounter (Signed)
Pt had surgery back in January on Deviated septum and was not cleared to continue CPAP until months after surgery. He now has been cleared by ENT and needs to restart back on CPAP. I have faxed over surgery notes to [employer] and see what I need to do about getting patient back in compliance  and coverage of supplies and machines.

## 2022-09-10 DIAGNOSIS — F4322 Adjustment disorder with anxiety: Secondary | ICD-10-CM | POA: Diagnosis not present

## 2022-09-16 NOTE — Telephone Encounter (Signed)
This is a message from the compliance team.  Unfortunately he is still non  compliant with the use  so he will need to use until compliance is met and then see the MD for follow up.   He has a Optician, dispensing so  a new study isn't  required  Pt was notified to use his equipment that he paid out of pocket for and to get in compliance for 30-90 days and then follow-up so we can send ov notes to insurance for compliance for them to start paying for equipment again

## 2022-10-29 ENCOUNTER — Telehealth: Payer: Self-pay | Admitting: Medical

## 2022-10-29 ENCOUNTER — Encounter: Payer: BC Managed Care – PPO | Admitting: Medical

## 2022-10-29 DIAGNOSIS — F4322 Adjustment disorder with anxiety: Secondary | ICD-10-CM | POA: Diagnosis not present

## 2022-10-29 NOTE — Telephone Encounter (Signed)

## 2022-11-05 ENCOUNTER — Ambulatory Visit (INDEPENDENT_AMBULATORY_CARE_PROVIDER_SITE_OTHER): Payer: BC Managed Care – PPO | Admitting: Pulmonary Disease

## 2022-11-05 ENCOUNTER — Encounter: Payer: Self-pay | Admitting: Pulmonary Disease

## 2022-11-05 ENCOUNTER — Telehealth: Payer: Self-pay | Admitting: Medical

## 2022-11-05 VITALS — BP 104/62 | HR 95 | Ht 70.0 in | Wt 228.0 lb

## 2022-11-05 DIAGNOSIS — G4733 Obstructive sleep apnea (adult) (pediatric): Secondary | ICD-10-CM

## 2022-11-05 MED ORDER — LEVOTHYROXINE SODIUM 25 MCG PO TABS: 25.0000 ug | ORAL_TABLET | Freq: Every day | ORAL | 3 refills | Status: AC

## 2022-11-05 NOTE — Telephone Encounter (Signed)
Needs synthroid refill  Cpe scheduled for October

## 2022-11-05 NOTE — Patient Instructions (Signed)
We will contact the DME company with a prescription for new fullface mask  We will try and obtain your new BiPAP settings  Call us if despite trial of new fullface mask, you are still not feeling it is working well  We may need to make pressure changes  Keep an eye on the number of events the machine is saying you are still having  Continue weight loss efforts  Try and be as regimented as you can with your sleep habits, try and get about 7 to 8 hours of sleep  Call us with significant concerns

## 2022-11-05 NOTE — Telephone Encounter (Signed)
done

## 2022-11-05 NOTE — Progress Notes (Signed)
Elijah [name]    [id number]    08-09-1995  Primary Care Physician:Tysinger, Kermit Balo, PA-C  Referring Physician: Jac Canavan, PA-C [address] [place],  [place] [address]  Chief complaint:   Patient with severe obstructive sleep apnea  HPI:  Severe obstructive sleep apnea on BiPAP therapy Was titrated to high pressures of 25/22  Pressure has been decreased since it was set up, unsure of what the pressure setting is at present  DME is [employer]  Sleep study reviewed showing over 82 events an hour He was titrated to 25/22-.study was reviewed  Usually goes to bed between 12 and 3 AM Takes him about 10 to 20 minutes to fall asleep multiple awakenings Final wake up about 11 AM  Weight has been up and down but gradually over the last few months has been losing some weight  His dad snored Does not smoke He does have a history of reflux, headaches, nasal stuffiness and congestion  He did see ENT at some point and had a discussion about an inspire device  He had septoplasty and turbinate reduction, he had had discussions about an inspire device as an option of treatment if unable to tolerate BiPAP  He does have some mask issues feels the mask leaks  Only able to stay on the device for couple of hours on some nights he does take it off Unbeknownst to him sometimes  Outpatient Encounter Medications as of 11/05/2022  Medication Sig   fluticasone (FLONASE) 50 MCG/ACT nasal spray Place 2 sprays into both nostrils daily. (Patient not taking: Reported on 06/25/2022)   levothyroxine (SYNTHROID) 25 MCG tablet Take 1 tablet (25 mcg total) by mouth daily. (Patient not taking: Reported on 11/05/2022)   predniSONE (DELTASONE) 10 MG tablet 6 tablets all together day 1, 5 tablets day 2, 4 tablets day 3, 3 tablets day 4, 2 tablets day 5, 1 tablet day 6. (Patient not taking: Reported on 11/05/2022)   No facility-administered encounter medications on file as of 11/05/2022.     Allergies as of 11/05/2022   (No Known Allergies)    Past Medical History:  Diagnosis Date   Acne    Depression    Gastroesophageal reflux disease without esophagitis 03/19/2021   Hypothyroidism    Scoliosis    Sleep apnea     Past Surgical History:  Procedure Laterality Date   DRUG INDUCED ENDOSCOPY Bilateral 04/29/2022   Procedure: DRUG INDUCED ENDOSCOPY;  Surgeon: Christia Reading, MD;  Location: [facility];  Service: ENT;  Laterality: Bilateral;   NASAL SEPTOPLASTY W/ TURBINOPLASTY Bilateral 04/29/2022   Procedure: NASAL SEPTOPLASTY WITH TURBINATE REDUCTION;  Surgeon: Christia Reading, MD;  Location: [facility];  Service: ENT;  Laterality: Bilateral;   TYMPANOSTOMY TUBE PLACEMENT      Family History  Problem Relation Age of Onset   Diabetes Father    Colon cancer Neg Hx    Esophageal cancer Neg Hx    Stomach cancer Neg Hx    Pancreatic cancer Neg Hx     Social History   Socioeconomic History   Marital status: Single    Spouse name: Not on file   Number of children: Not on file   Years of education: Not on file   Highest education level: Not on file  Occupational History   Not on file  Tobacco Use   Smoking status: Never   Smokeless tobacco: Never  Vaping Use  Vaping Use: Never used  Substance and Sexual Activity   Alcohol use: Never   Drug use: Yes    Types: Marijuana   Sexual activity: Not on file  Other Topics Concern   Not on file  Social History Narrative   Not on file   Social Determinants of Health   Financial Resource Strain: Not on file  Food Insecurity: Not on file  Transportation Needs: Not on file  Physical Activity: Not on file  Stress: Not on file  Social Connections: Not on file  Intimate Partner Violence: Not on file    Review of Systems  Constitutional:  Positive for fatigue.  Respiratory:  Positive for apnea.   Psychiatric/Behavioral:  Positive for sleep disturbance.     Vitals:   11/05/22 1452   BP: 104/62  Pulse: 95  SpO2: 96%     Physical Exam Constitutional:      Appearance: He is obese.  HENT:     Head: Normocephalic.     Nose: Nose normal.     Mouth/Throat:     Mouth: Mucous membranes are moist.  Eyes:     General: No scleral icterus. Cardiovascular:     Rate and Rhythm: Normal rate and regular rhythm.     Heart sounds: No murmur heard.    No friction rub.  Pulmonary:     Effort: No respiratory distress.     Breath sounds: No stridor. No wheezing or rhonchi.  Musculoskeletal:     Cervical back: No rigidity or tenderness.  Neurological:     Mental Status: He is alert.  Psychiatric:        Mood and Affect: Mood normal.      Data Reviewed: Sleep study reviewed showing very severe obstructive sleep apnea Titration study reviewed showing titrated to BiPAP 25/22  Compliance data are not fully available but he had an app on his phone that I was able to review showing an AHI of 5.51 Only leaving the machine on a couple of hours  Assessment:  Very severe obstructive sleep apnea  S/p septoplasty and turbinate reduction  Continues to have some mask issues with his BiPAP, currently using a fullface mask  Class I obesity -Is working on weight loss efforts  Plan/Recommendations: Continue weight loss efforts  Continue using BiPAP  Referral to DME for attempt at a different fullface mask  Continue to monitor compliance  Optimize sleep hygiene  May need pressure reduction  Will try and get information from his DME company regarding his pressure settings at present -I could not find the order for the pressure reduction or where his pressures are set at present  Encouraged to call with significant concerns  Follow-up in 3 months    Virl Diamond MD [facility] 11/05/2022, 2:57 PM  CC: Jac Canavan, PA-C

## 2022-11-07 ENCOUNTER — Encounter: Payer: Self-pay | Admitting: Medical

## 2022-11-12 DIAGNOSIS — F4322 Adjustment disorder with anxiety: Secondary | ICD-10-CM | POA: Diagnosis not present

## 2022-11-13 ENCOUNTER — Encounter: Payer: Self-pay | Admitting: Pulmonary Disease

## 2022-11-13 DIAGNOSIS — G4733 Obstructive sleep apnea (adult) (pediatric): Secondary | ICD-10-CM

## 2022-11-25 NOTE — Telephone Encounter (Signed)
Can we kindly confirm that order was sent to DME for new fullface mask for this patient.  This was the plan during his last visit to the office.  Kindly confirm pressures that he is set on from DME company and let me know please.  This was not available on his phone app

## 2022-12-04 DIAGNOSIS — F332 Major depressive disorder, recurrent severe without psychotic features: Secondary | ICD-10-CM | POA: Diagnosis not present

## 2022-12-04 DIAGNOSIS — G5622 Lesion of ulnar nerve, left upper limb: Secondary | ICD-10-CM | POA: Diagnosis not present

## 2022-12-04 DIAGNOSIS — M546 Pain in thoracic spine: Secondary | ICD-10-CM | POA: Diagnosis not present

## 2022-12-04 DIAGNOSIS — R45851 Suicidal ideations: Secondary | ICD-10-CM | POA: Diagnosis not present

## 2022-12-04 DIAGNOSIS — Z1331 Encounter for screening for depression: Secondary | ICD-10-CM | POA: Diagnosis not present

## 2022-12-08 ENCOUNTER — Telehealth: Payer: Self-pay | Admitting: Pulmonary Disease

## 2022-12-08 NOTE — Telephone Encounter (Signed)
Per chart order for supplies was placed 12/01/22 and confirmed by [employer] on 12/02/22. They should be contact patient soon.   MyChart message sent to patient.

## 2022-12-09 NOTE — Telephone Encounter (Signed)
PT's mom calling. Wants to speak to office manager,. Frustrated over getting a mask for her son. States she "feels like a number" at this point. LM for Andris Flurry. PT states she is willing to come in as well. This has taken over a month. Her # is [phone]

## 2023-01-26 ENCOUNTER — Ambulatory Visit: Payer: BC Managed Care – PPO | Admitting: Pulmonary Disease

## 2023-01-28 ENCOUNTER — Encounter: Payer: Self-pay | Admitting: Medical

## 2023-01-28 ENCOUNTER — Encounter: Payer: BC Managed Care – PPO | Admitting: Medical

## 2023-01-28 DIAGNOSIS — Z0000 Encounter for general adult medical examination without abnormal findings: Secondary | ICD-10-CM | POA: Insufficient documentation

## 2023-01-28 NOTE — Progress Notes (Deleted)
Tdap Flu  Routine lab

## 2023-06-30 ENCOUNTER — Ambulatory Visit: Payer: BC Managed Care – PPO | Admitting: Medical

## 2023-06-30 ENCOUNTER — Encounter: Payer: Self-pay | Admitting: Medical

## 2023-06-30 VITALS — BP 110/70 | HR 60 | Ht 70.0 in | Wt 216.0 lb

## 2023-06-30 DIAGNOSIS — F410 Panic disorder [episodic paroxysmal anxiety] without agoraphobia: Secondary | ICD-10-CM | POA: Diagnosis not present

## 2023-06-30 DIAGNOSIS — F419 Anxiety disorder, unspecified: Secondary | ICD-10-CM | POA: Diagnosis not present

## 2023-06-30 DIAGNOSIS — G4733 Obstructive sleep apnea (adult) (pediatric): Secondary | ICD-10-CM | POA: Diagnosis not present

## 2023-06-30 NOTE — Progress Notes (Signed)
 Subjective:  Elijah [name] is a 28 y.o. male who presents for Chief Complaint  Patient presents with   Anxiety    Thinks he may have had a panic attack last Wed night. Body locked up, had some chest pain Thurs/Fri. Better now.      Here for concerns for panic attack and anxiety.   Had bad panic attack last week.  When this happened whole body locked up, nerves were going crazy.   Couldn't speak when it happened.  Has had issues with anxiety prior.  Has been on medication prior.   Last medication for this was 1.5 years ago.  Was on Vryalar prior.  Other prior medications include Caplyta.  Is currently in counseling currently, just started back last week.  He notes he was in a big slump for past several months.  Has bad social anxiety.   Money has been a big stressor.  Lives with mother, hasn't been working for a while dealing with depressed mood slump.     Is compliant with CPAP lately  Has intentionally lost weight in recent months.  Had physical with [facility] recently but didn't hear back from them regarding labs/results.  Was using synthroid prior but recent labs were ok  No other aggravating or relieving factors.    No other c/o.   Past Medical History:  Diagnosis Date   Acne    Depression    Gastroesophageal reflux disease without esophagitis 03/19/2021   Hypothyroidism    Scoliosis    Sleep apnea    Current Outpatient Medications on File Prior to Visit  Medication Sig Dispense Refill   fluticasone (FLONASE) 50 MCG/ACT nasal spray Place 2 sprays into both nostrils daily. (Patient not taking: Reported on 06/30/2023) 16 g 1   levothyroxine (SYNTHROID) 25 MCG tablet Take 1 tablet (25 mcg total) by mouth daily. (Patient not taking: Reported on 06/30/2023) 30 tablet 3   No current facility-administered medications on file prior to visit.     The following portions of the patient's history were reviewed and updated as appropriate: allergies, current medications, past family  history, past medical history, past social history, past surgical history and problem list.  ROS Otherwise as in subjective above     Objective: BP 110/70   Pulse 60   Ht 5\' 10"  (1.778 m)   Wt 216 lb (98 kg)   BMI 30.99 kg/m   Wt Readings from Last 3 Encounters:  06/30/23 216 lb (98 kg)  11/05/22 228 lb (103.4 kg)  06/25/22 234 lb 9.6 oz (106.4 kg)    General appearance: alert, no distress, well developed, well nourished Psych: pleasant, answers questions appropriately   Assessment: Encounter Diagnoses  Name Primary?   Anxiety Yes   Panic attack    OSA (obstructive sleep apnea)      Plan: We discussed his concerns, his recent panic attack.  We discussed his stressors and ways to deal with anxiety.  We discussed the potential for medication.  He will hold off on this for the time being.  Continue with counseling that he just started back with.  We discussed ways to deal with stress and anxiety. I recommend regular exercise such as 30 minutes or more most days of the week such as walking running and bicycling I recommend taking some time to meditate or pray daily to help slow racing thoughts. I recommend working on relaxation techniques such as deep breathing exercises in a comfortable position relaxing your body.  There are  free Apps on the smart phone for this for example Consider getting a massage Journal or use diary to express your ideas on paper to cope with anxiety and stress Work on time management, use a calendar or plan out things to avoid stressing about things. Find ways to utilize your time to include exercise and personal "me" time. Some people use aromatherapy such as lavender to relax Some people use herbal teas to help calm their mood Spend some time with animals or your pet if you have one   [name] was seen today for anxiety.  Diagnoses and all orders for this visit:  Anxiety  Panic attack  OSA (obstructive sleep apnea)    Follow up:  34mo

## 2023-11-25 DIAGNOSIS — D485 Neoplasm of uncertain behavior of skin: Secondary | ICD-10-CM | POA: Diagnosis not present

## 2023-11-25 DIAGNOSIS — D2271 Melanocytic nevi of right lower limb, including hip: Secondary | ICD-10-CM | POA: Diagnosis not present

## 2023-11-25 DIAGNOSIS — D2262 Melanocytic nevi of left upper limb, including shoulder: Secondary | ICD-10-CM | POA: Diagnosis not present

## 2023-11-25 DIAGNOSIS — D2261 Melanocytic nevi of right upper limb, including shoulder: Secondary | ICD-10-CM | POA: Diagnosis not present

## 2023-11-25 DIAGNOSIS — D225 Melanocytic nevi of trunk: Secondary | ICD-10-CM | POA: Diagnosis not present

## 2023-11-25 DIAGNOSIS — L905 Scar conditions and fibrosis of skin: Secondary | ICD-10-CM | POA: Diagnosis not present

## 2023-12-14 ENCOUNTER — Encounter: Admitting: Medical

## 2023-12-14 DIAGNOSIS — Z0000 Encounter for general adult medical examination without abnormal findings: Secondary | ICD-10-CM

## 2023-12-16 ENCOUNTER — Encounter: Payer: Self-pay | Admitting: Medical

## 2023-12-16 ENCOUNTER — Telehealth: Payer: Self-pay | Admitting: Medical

## 2023-12-16 NOTE — Telephone Encounter (Signed)
 This patient no showed for their appointment today. Which of the following is necessary for this patient.   A) No follow-up necessary   B) Follow-up urgent. Locate Patient Immediately.   C) Follow-up necessary. Contact patient and Schedule visit in ____ Days.   D) Follow-up Advised. Contact patient and Schedule visit in ____ Days.   E) Please Send no show letter to patient. Charge no show fee if no show was a CPE.  Looks like he has several no shows within the past 2 years?

## 2024-01-14 DIAGNOSIS — L080 Pyoderma: Secondary | ICD-10-CM | POA: Diagnosis not present

## 2024-01-14 DIAGNOSIS — L720 Epidermal cyst: Secondary | ICD-10-CM | POA: Diagnosis not present

## 2024-03-09 DIAGNOSIS — Z419 Encounter for procedure for purposes other than remedying health state, unspecified: Secondary | ICD-10-CM | POA: Diagnosis not present
